# Patient Record
Sex: Female | Born: 1950
Health system: Southern US, Community
[De-identification: ages and names within clinical notes are randomized; demographics above are authoritative.]

## PROBLEM LIST (undated history)

## (undated) DIAGNOSIS — F32A Depression, unspecified: Secondary | ICD-10-CM

## (undated) DIAGNOSIS — D860 Sarcoidosis of lung: Secondary | ICD-10-CM

## (undated) DIAGNOSIS — E785 Hyperlipidemia, unspecified: Secondary | ICD-10-CM

## (undated) DIAGNOSIS — M797 Fibromyalgia: Secondary | ICD-10-CM

## (undated) DIAGNOSIS — J310 Chronic rhinitis: Secondary | ICD-10-CM

## (undated) DIAGNOSIS — I2699 Other pulmonary embolism without acute cor pulmonale: Secondary | ICD-10-CM

## (undated) DIAGNOSIS — F419 Anxiety disorder, unspecified: Secondary | ICD-10-CM

## (undated) DIAGNOSIS — F329 Major depressive disorder, single episode, unspecified: Secondary | ICD-10-CM

## (undated) DIAGNOSIS — G459 Transient cerebral ischemic attack, unspecified: Secondary | ICD-10-CM

## (undated) DIAGNOSIS — I10 Essential (primary) hypertension: Secondary | ICD-10-CM

## (undated) HISTORY — PX: HYSTEROSCOPY: SHX211

## (undated) HISTORY — PX: JOINT REPLACEMENT: SHX530

## (undated) HISTORY — PX: BUNIONECTOMY: SHX129

## (undated) HISTORY — PX: OTHER SURGICAL HISTORY: SHX169

## (undated) HISTORY — PX: LUMBAR LAMINECTOMY: SHX95

## (undated) HISTORY — PX: ABDOMINAL HYSTERECTOMY: SHX81

---

## 2007-05-09 ENCOUNTER — Encounter: Admission: RE | Admit: 2007-05-09 | Discharge: 2007-05-09 | Payer: Self-pay | Admitting: Internal Medicine

## 2008-05-10 ENCOUNTER — Encounter: Admission: RE | Admit: 2008-05-10 | Discharge: 2008-05-10 | Payer: Self-pay | Admitting: Family Medicine

## 2009-04-25 ENCOUNTER — Ambulatory Visit: Payer: Self-pay | Admitting: Diagnostic Radiology

## 2009-04-25 ENCOUNTER — Emergency Department (HOSPITAL_BASED_OUTPATIENT_CLINIC_OR_DEPARTMENT_OTHER): Admission: EM | Admit: 2009-04-25 | Discharge: 2009-04-25 | Payer: Self-pay | Admitting: Emergency Medicine

## 2009-05-12 ENCOUNTER — Encounter: Admission: RE | Admit: 2009-05-12 | Discharge: 2009-05-12 | Payer: Self-pay | Admitting: Unknown Physician Specialty

## 2010-07-15 ENCOUNTER — Encounter: Payer: Self-pay | Admitting: Internal Medicine

## 2011-02-26 ENCOUNTER — Encounter: Payer: Self-pay | Admitting: *Deleted

## 2011-02-26 ENCOUNTER — Emergency Department (INDEPENDENT_AMBULATORY_CARE_PROVIDER_SITE_OTHER): Payer: Medicare Other

## 2011-02-26 ENCOUNTER — Emergency Department (HOSPITAL_BASED_OUTPATIENT_CLINIC_OR_DEPARTMENT_OTHER)
Admission: EM | Admit: 2011-02-26 | Discharge: 2011-02-26 | Disposition: A | Discharge status: Home / Self Care | Payer: Medicare Other | Attending: Emergency Medicine | Admitting: Emergency Medicine

## 2011-02-26 DIAGNOSIS — E119 Type 2 diabetes mellitus without complications: Secondary | ICD-10-CM | POA: Insufficient documentation

## 2011-02-26 DIAGNOSIS — Z8679 Personal history of other diseases of the circulatory system: Secondary | ICD-10-CM | POA: Insufficient documentation

## 2011-02-26 DIAGNOSIS — I10 Essential (primary) hypertension: Secondary | ICD-10-CM | POA: Insufficient documentation

## 2011-02-26 DIAGNOSIS — M7989 Other specified soft tissue disorders: Secondary | ICD-10-CM

## 2011-02-26 DIAGNOSIS — E785 Hyperlipidemia, unspecified: Secondary | ICD-10-CM | POA: Insufficient documentation

## 2011-02-26 DIAGNOSIS — M79606 Pain in leg, unspecified: Secondary | ICD-10-CM

## 2011-02-26 DIAGNOSIS — M79609 Pain in unspecified limb: Secondary | ICD-10-CM

## 2011-02-26 HISTORY — DX: Major depressive disorder, single episode, unspecified: F32.9

## 2011-02-26 HISTORY — DX: Essential (primary) hypertension: I10

## 2011-02-26 HISTORY — DX: Chronic rhinitis: J31.0

## 2011-02-26 HISTORY — DX: Hyperlipidemia, unspecified: E78.5

## 2011-02-26 HISTORY — DX: Sarcoidosis of lung: D86.0

## 2011-02-26 HISTORY — DX: Transient cerebral ischemic attack, unspecified: G45.9

## 2011-02-26 HISTORY — DX: Fibromyalgia: M79.7

## 2011-02-26 HISTORY — DX: Anxiety disorder, unspecified: F41.9

## 2011-02-26 HISTORY — DX: Depression, unspecified: F32.A

## 2011-02-26 NOTE — ED Notes (Signed)
Patient states she has pain in her left calf for the last 2 days.  Hx DVT.  Has artifical knee on right and right leg feels tingling.  States she does not have any energy.  States she also have inflammation on her left side and has been treated with ibuprofen by her pcp.

## 2011-02-26 NOTE — ED Provider Notes (Addendum)
History     CSN: [id number] Arrival date & time: 02/26/2011 10:46 AM  Chief Complaint  Patient presents with  . Leg Pain    pain in her left calf for the last 2 days,  artificial knee on right does not feel right.   HPI Lower extremity pain  Patient has noted pain in her left calf for last 2 days. She denies history of DVT. She states that she has had a PE in the past. She denies associated and mobilized or on any long trips. She has not taken any hormones. Denies any in injury to this area. She denies any chest pain or shortness of breath.  Past Medical History  Diagnosis Date  . Diabetes mellitus   . Hypertension   . Hyperlipemia   . Rhinitis   . TIA (transient ischemic attack)     x 2  . Sarcoidosis of lung   . Fibromyalgia   . Anxiety   . Depressed     Past Surgical History  Procedure Date  . Joint replacement   . Cesarean section     x 2  . Bunionectomy   . Abdominal hysterectomy   . Hysteroscopy     No family history on file.  History  Substance Use Topics  . Smoking status: Former Games developer  . Smokeless tobacco: Not on file  . Alcohol Use: No    OB History    Grav Para Term Preterm Abortions TAB SAB Ect Mult Living                  Review of Systems  All other systems reviewed and are negative.    Physical Exam  BP 126/76  Pulse 98  Temp(Src) 98.5 F (36.9 C) (Oral)  Resp 20  Ht 5\' 4"  (1.626 m)  Wt 251 lb (113.853 kg)  BMI 43.08 kg/m2  SpO2 96%  Physical Exam  Vitals reviewed. Constitutional: She is oriented to person, place, and time. She appears well-developed and well-nourished.  HENT:  Head: Normocephalic.  Eyes: Pupils are equal, round, and reactive to light.  Neck: Normal range of motion.  Cardiovascular: Normal rate.   Musculoskeletal: Normal range of motion. She exhibits no edema.  Neurological: She is alert and oriented to person, place, and time. She has normal reflexes.  Skin: Skin is warm.  Psychiatric: She has a normal  mood and affect.    ED Course  Procedures  MDM Patient with negative ultrasound for DVT.      Hilario Quarry, MD 03/07/11 Rickey Primus  Hilario Quarry, MD 03/07/11 Rickey Primus

## 2013-02-14 ENCOUNTER — Emergency Department (HOSPITAL_BASED_OUTPATIENT_CLINIC_OR_DEPARTMENT_OTHER)
Admission: EM | Admit: 2013-02-14 | Discharge: 2013-02-14 | Disposition: A | Discharge status: Home / Self Care | Payer: Medicare Other | Attending: Emergency Medicine | Admitting: Emergency Medicine

## 2013-02-14 ENCOUNTER — Encounter (HOSPITAL_BASED_OUTPATIENT_CLINIC_OR_DEPARTMENT_OTHER): Payer: Self-pay | Admitting: *Deleted

## 2013-02-14 DIAGNOSIS — Z880 Allergy status to penicillin: Secondary | ICD-10-CM | POA: Insufficient documentation

## 2013-02-14 DIAGNOSIS — Z87891 Personal history of nicotine dependence: Secondary | ICD-10-CM | POA: Insufficient documentation

## 2013-02-14 DIAGNOSIS — F3289 Other specified depressive episodes: Secondary | ICD-10-CM | POA: Insufficient documentation

## 2013-02-14 DIAGNOSIS — F329 Major depressive disorder, single episode, unspecified: Secondary | ICD-10-CM | POA: Insufficient documentation

## 2013-02-14 DIAGNOSIS — M7989 Other specified soft tissue disorders: Secondary | ICD-10-CM | POA: Insufficient documentation

## 2013-02-14 DIAGNOSIS — Z8673 Personal history of transient ischemic attack (TIA), and cerebral infarction without residual deficits: Secondary | ICD-10-CM | POA: Insufficient documentation

## 2013-02-14 DIAGNOSIS — E785 Hyperlipidemia, unspecified: Secondary | ICD-10-CM | POA: Insufficient documentation

## 2013-02-14 DIAGNOSIS — IMO0002 Reserved for concepts with insufficient information to code with codable children: Secondary | ICD-10-CM | POA: Insufficient documentation

## 2013-02-14 DIAGNOSIS — Z8619 Personal history of other infectious and parasitic diseases: Secondary | ICD-10-CM | POA: Insufficient documentation

## 2013-02-14 DIAGNOSIS — F411 Generalized anxiety disorder: Secondary | ICD-10-CM | POA: Insufficient documentation

## 2013-02-14 DIAGNOSIS — Z79899 Other long term (current) drug therapy: Secondary | ICD-10-CM | POA: Insufficient documentation

## 2013-02-14 DIAGNOSIS — I10 Essential (primary) hypertension: Secondary | ICD-10-CM | POA: Insufficient documentation

## 2013-02-14 DIAGNOSIS — Z86711 Personal history of pulmonary embolism: Secondary | ICD-10-CM | POA: Insufficient documentation

## 2013-02-14 DIAGNOSIS — Z7982 Long term (current) use of aspirin: Secondary | ICD-10-CM | POA: Insufficient documentation

## 2013-02-14 DIAGNOSIS — E119 Type 2 diabetes mellitus without complications: Secondary | ICD-10-CM | POA: Insufficient documentation

## 2013-02-14 DIAGNOSIS — Z87440 Personal history of urinary (tract) infections: Secondary | ICD-10-CM | POA: Insufficient documentation

## 2013-02-14 DIAGNOSIS — IMO0001 Reserved for inherently not codable concepts without codable children: Secondary | ICD-10-CM | POA: Insufficient documentation

## 2013-02-14 HISTORY — DX: Other pulmonary embolism without acute cor pulmonale: I26.99

## 2013-02-14 LAB — URINALYSIS, ROUTINE W REFLEX MICROSCOPIC
Glucose, UA: NEGATIVE mg/dL
Hgb urine dipstick: NEGATIVE
Leukocytes, UA: NEGATIVE
Protein, ur: NEGATIVE mg/dL
pH: 6.5 (ref 5.0–8.0)

## 2013-02-14 NOTE — ED Provider Notes (Signed)
CSN: [id number]     Arrival date & time 02/14/13  1148 History     First MD Initiated Contact with Patient 02/14/13 1311     Chief Complaint  Patient presents with  . Leg Swelling   (Consider location/radiation/quality/duration/timing/severity/associated sxs/prior Treatment) HPI Comments: 62 yo female with complaints of left leg swelling in setting of being treated for UTI with septra.  She feels a mild heaviness to left leg, but no significant pain.    Patient is a 62 y.o. female presenting with leg pain.  Leg Pain Location:  Leg Injury: no   Leg location:  L leg Pain details:    Quality: "heavy"   Radiates to:  Does not radiate   Severity:  Mild   Onset quality:  Gradual   Duration:  1 day   Timing:  Constant   Progression:  Worsening Relieved by:  Nothing Worsened by:  Nothing tried Associated symptoms: swelling   Associated symptoms: no fever, no muscle weakness and no numbness     Past Medical History  Diagnosis Date  . Diabetes mellitus   . Hypertension   . Hyperlipemia   . Rhinitis   . TIA (transient ischemic attack)     x 2  . Sarcoidosis of lung   . Fibromyalgia   . Anxiety   . Depressed   . TIA (transient ischemic attack)   . PE (pulmonary embolism)    Past Surgical History  Procedure Laterality Date  . Joint replacement    . Cesarean section      x 2  . Bunionectomy    . Abdominal hysterectomy    . Hysteroscopy    . Lumbar laminectomy     No family history on file. History  Substance Use Topics  . Smoking status: Former Games developer  . Smokeless tobacco: Not on file  . Alcohol Use: No   OB History   Grav Para Term Preterm Abortions TAB SAB Ect Mult Living                 Review of Systems  Constitutional: Negative for fever.  HENT: Negative for congestion.   Cardiovascular: Negative for chest pain.  Gastrointestinal: Negative for nausea, vomiting, abdominal pain and diarrhea.  All other systems reviewed and are negative.    Allergies   Amoxicillin-pot clavulanate and Contrast media  Home Medications   Current Outpatient Rx  Name  Route  Sig  Dispense  Refill  . furosemide (LASIX) 40 MG tablet   Oral   Take 40 mg by mouth.         Marland Kitchen LORazepam (ATIVAN) 0.5 MG tablet   Oral   Take 0.5 mg by mouth every 8 (eight) hours.         Marland Kitchen acetaminophen (TYLENOL) 500 MG tablet   Oral   Take 1,000 mg by mouth every 6 (six) hours as needed.           Marland Kitchen albuterol (PROVENTIL) (2.5 MG/3ML) 0.083% nebulizer solution   Nebulization   Take by nebulization every 6 (six) hours as needed.           Marland Kitchen aspirin 81 MG tablet   Oral   Take 81 mg by mouth daily.           . cholecalciferol (VITAMIN D) 1000 UNITS tablet   Oral   Take 1,000 Units by mouth daily.           . fish oil-omega-3 fatty acids 1000 MG capsule  Oral   Take 2 g by mouth daily.           . fluticasone (FLONASE) 50 MCG/ACT nasal spray   Nasal   Place 2 sprays into the nose daily.           . folic acid (FOLVITE) 1 MG tablet   Oral   Take 1 mg by mouth daily.           Marland Kitchen ibuprofen (ADVIL,MOTRIN) 100 MG chewable tablet   Oral   Chew 100 mg by mouth every 8 (eight) hours as needed.           Marland Kitchen lisinopril (PRINIVIL,ZESTRIL) 5 MG tablet   Oral   Take 5 mg by mouth daily.           Marland Kitchen loratadine (CLARITIN) 10 MG tablet   Oral   Take 10 mg by mouth daily.           Marland Kitchen LORazepam (ATIVAN) 0.5 MG tablet   Oral   Take 0.5 mg by mouth every 8 (eight) hours.           . lovastatin (ALTOPREV) 40 MG 24 hr tablet   Oral   Take 80 mg by mouth at bedtime.           . metFORMIN (GLUMETZA) 1000 MG (MOD) 24 hr tablet   Oral   Take 1,000 mg by mouth daily with breakfast.           . pyridOXINE (VITAMIN B-6) 100 MG tablet   Oral   Take 100 mg by mouth daily.           . vitamin B-12 (CYANOCOBALAMIN) 500 MCG tablet   Oral   Take 500 mcg by mouth daily.            BP 124/72  Pulse 85  Temp(Src) 98.4 F (36.9 C) (Oral)   Resp 18  Ht 5\' 4"  (1.626 m)  Wt 252 lb (114.306 kg)  BMI 43.23 kg/m2  SpO2 96% Physical Exam  Nursing note and vitals reviewed. Constitutional: She is oriented to person, place, and time. She appears well-developed and well-nourished. No distress.  HENT:  Head: Normocephalic and atraumatic.  Mouth/Throat: Oropharynx is clear and moist.  Eyes: Conjunctivae are normal. Pupils are equal, round, and reactive to light. No scleral icterus.  Neck: Neck supple.  Cardiovascular: Normal rate, regular rhythm, normal heart sounds and intact distal pulses.   No murmur heard. Pulmonary/Chest: Effort normal and breath sounds normal. No stridor. No respiratory distress. She has no rales.  Abdominal: Soft. Bowel sounds are normal. She exhibits no distension. There is no tenderness.  Musculoskeletal: Normal range of motion.  Left leg comparable in size to right without any discernable edema or pitting.  2+ distal pulse, normal sensation and strength.    Neurological: She is alert and oriented to person, place, and time.  Skin: Skin is warm and dry. No rash noted.  Psychiatric: She has a normal mood and affect. Her behavior is normal.    ED Course   Procedures (including critical care time)  Labs Reviewed  URINALYSIS, ROUTINE W REFLEX MICROSCOPIC   No results found. 1. Left leg swelling     MDM  62 yo female with hx of PE and left knee replacement presenting with left leg heaviness and subjective swelling since yesterday.  No apparent swelling on exam and legs are comparable diameter on objective measurement.  No signs of DVT, decreased perfusion, stroke, or infection.  She subsequently remembered that she has not taken her furosemide in several doses.  Offered D-Dimer testing, but she declined.  She will follow up with PCP if symptoms still present on Monday.    Candyce Churn, MD 02/14/13 [phone]

## 2013-02-14 NOTE — ED Notes (Signed)
Patient is currently being treated for UTI and now is having leg swelling.

## 2014-07-04 ENCOUNTER — Emergency Department (HOSPITAL_COMMUNITY): Payer: Medicare HMO

## 2014-07-04 ENCOUNTER — Encounter (HOSPITAL_COMMUNITY): Payer: Self-pay

## 2014-07-04 ENCOUNTER — Observation Stay (HOSPITAL_COMMUNITY)
Admission: EM | Admit: 2014-07-04 | Discharge: 2014-07-05 | Disposition: A | Discharge status: Home / Self Care | Payer: Medicare HMO | Attending: Internal Medicine | Admitting: Internal Medicine

## 2014-07-04 DIAGNOSIS — E785 Hyperlipidemia, unspecified: Secondary | ICD-10-CM

## 2014-07-04 DIAGNOSIS — R079 Chest pain, unspecified: Secondary | ICD-10-CM | POA: Diagnosis present

## 2014-07-04 DIAGNOSIS — D860 Sarcoidosis of lung: Secondary | ICD-10-CM | POA: Diagnosis not present

## 2014-07-04 DIAGNOSIS — F329 Major depressive disorder, single episode, unspecified: Secondary | ICD-10-CM | POA: Diagnosis not present

## 2014-07-04 DIAGNOSIS — Z79899 Other long term (current) drug therapy: Secondary | ICD-10-CM | POA: Diagnosis not present

## 2014-07-04 DIAGNOSIS — Z7982 Long term (current) use of aspirin: Secondary | ICD-10-CM | POA: Diagnosis not present

## 2014-07-04 DIAGNOSIS — Z86711 Personal history of pulmonary embolism: Secondary | ICD-10-CM | POA: Insufficient documentation

## 2014-07-04 DIAGNOSIS — Z880 Allergy status to penicillin: Secondary | ICD-10-CM | POA: Diagnosis not present

## 2014-07-04 DIAGNOSIS — D869 Sarcoidosis, unspecified: Secondary | ICD-10-CM

## 2014-07-04 DIAGNOSIS — F419 Anxiety disorder, unspecified: Secondary | ICD-10-CM | POA: Diagnosis not present

## 2014-07-04 DIAGNOSIS — E119 Type 2 diabetes mellitus without complications: Secondary | ICD-10-CM | POA: Diagnosis not present

## 2014-07-04 DIAGNOSIS — Z7952 Long term (current) use of systemic steroids: Secondary | ICD-10-CM | POA: Diagnosis not present

## 2014-07-04 DIAGNOSIS — Z87891 Personal history of nicotine dependence: Secondary | ICD-10-CM | POA: Diagnosis not present

## 2014-07-04 DIAGNOSIS — Z8673 Personal history of transient ischemic attack (TIA), and cerebral infarction without residual deficits: Secondary | ICD-10-CM | POA: Diagnosis not present

## 2014-07-04 DIAGNOSIS — R112 Nausea with vomiting, unspecified: Secondary | ICD-10-CM | POA: Diagnosis not present

## 2014-07-04 DIAGNOSIS — M797 Fibromyalgia: Secondary | ICD-10-CM | POA: Insufficient documentation

## 2014-07-04 DIAGNOSIS — Z6841 Body Mass Index (BMI) 40.0 and over, adult: Secondary | ICD-10-CM

## 2014-07-04 DIAGNOSIS — I10 Essential (primary) hypertension: Secondary | ICD-10-CM

## 2014-07-04 LAB — I-STAT TROPONIN, ED
TROPONIN I, POC: 0 ng/mL (ref 0.00–0.08)
Troponin i, poc: 0 ng/mL (ref 0.00–0.08)
Troponin i, poc: 0 ng/mL (ref 0.00–0.08)

## 2014-07-04 LAB — COMPREHENSIVE METABOLIC PANEL
ALK PHOS: 66 U/L (ref 39–117)
ALT: 17 U/L (ref 0–35)
ANION GAP: 9 (ref 5–15)
AST: 17 U/L (ref 0–37)
Albumin: 3.5 g/dL (ref 3.5–5.2)
BILIRUBIN TOTAL: 1 mg/dL (ref 0.3–1.2)
BUN: 10 mg/dL (ref 6–23)
CHLORIDE: 103 meq/L (ref 96–112)
CO2: 27 mmol/L (ref 19–32)
Calcium: 9.2 mg/dL (ref 8.4–10.5)
Creatinine, Ser: 0.63 mg/dL (ref 0.50–1.10)
GFR calc Af Amer: >90 mL/min (ref >90)
Glucose, Bld: 80 mg/dL (ref 70–99)
POTASSIUM: 3.6 mmol/L (ref 3.5–5.1)
SODIUM: 139 mmol/L (ref 135–145)
TOTAL PROTEIN: 6.8 g/dL (ref 6.0–8.3)

## 2014-07-04 LAB — CBC
HEMATOCRIT: 38 % (ref 36.0–46.0)
Hemoglobin: 12.2 g/dL (ref 12.0–15.0)
MCH: 27.5 pg (ref 26.0–34.0)
MCHC: 32.1 g/dL (ref 30.0–36.0)
MCV: 85.8 fL (ref 78.0–100.0)
Platelets: 263 10*3/uL (ref 150–400)
RBC: 4.43 MIL/uL (ref 3.87–5.11)
RDW: 14.3 % (ref 11.5–15.5)
WBC: 6 10*3/uL (ref 4.0–10.5)

## 2014-07-04 LAB — APTT: APTT: 25 s (ref 24–37)

## 2014-07-04 LAB — PROTIME-INR
INR: 1.04 (ref 0.00–1.49)
PROTHROMBIN TIME: 13.7 s (ref 11.6–15.2)

## 2014-07-04 LAB — D-DIMER, QUANTITATIVE: D-Dimer, Quant: 0.44 ug/mL-FEU (ref 0.00–0.48)

## 2014-07-04 MED ORDER — FOLIC ACID 1 MG PO TABS
1.0000 mg | ORAL_TABLET | Freq: Every day | ORAL | Status: DC
  Administered 2014-07-05: 1 mg via ORAL
  Filled 2014-07-04: qty 1

## 2014-07-04 MED ORDER — ALUM & MAG HYDROXIDE-SIMETH 200-200-20 MG/5ML PO SUSP: 15.0000 mL | Freq: Four times a day (QID) | ORAL | Status: DC | PRN

## 2014-07-04 MED ORDER — IPRATROPIUM BROMIDE 0.02 % IN SOLN: 0.5000 mg | Freq: Four times a day (QID) | RESPIRATORY_TRACT | Status: DC | PRN

## 2014-07-04 MED ORDER — LISINOPRIL 2.5 MG PO TABS
2.5000 mg | ORAL_TABLET | Freq: Every day | ORAL | Status: DC
  Filled 2014-07-04 (×2): qty 1

## 2014-07-04 MED ORDER — ONDANSETRON HCL 4 MG/2ML IJ SOLN
INTRAMUSCULAR | Status: AC
  Filled 2014-07-04: qty 2

## 2014-07-04 MED ORDER — LORAZEPAM 0.5 MG PO TABS: 0.5000 mg | ORAL_TABLET | Freq: Two times a day (BID) | ORAL | Status: DC | PRN

## 2014-07-04 MED ORDER — ENOXAPARIN SODIUM 40 MG/0.4ML ~~LOC~~ SOLN
40.0000 mg | Freq: Every day | SUBCUTANEOUS | Status: DC
  Administered 2014-07-05: 40 mg via SUBCUTANEOUS
  Filled 2014-07-04 (×2): qty 0.4

## 2014-07-04 MED ORDER — LORATADINE 10 MG PO TABS
10.0000 mg | ORAL_TABLET | Freq: Every day | ORAL | Status: DC
  Administered 2014-07-05: 10 mg via ORAL
  Filled 2014-07-04 (×2): qty 1

## 2014-07-04 MED ORDER — PRAVASTATIN SODIUM 40 MG PO TABS
80.0000 mg | ORAL_TABLET | Freq: Every day | ORAL | Status: DC
  Filled 2014-07-04 (×2): qty 1

## 2014-07-04 MED ORDER — NITROGLYCERIN 2 % TD OINT
1.0000 [in_us] | TOPICAL_OINTMENT | Freq: Once | TRANSDERMAL | Status: AC
  Administered 2014-07-04: 1 [in_us] via TOPICAL
  Filled 2014-07-04: qty 1

## 2014-07-04 MED ORDER — MORPHINE SULFATE 4 MG/ML IJ SOLN
4.0000 mg | Freq: Once | INTRAMUSCULAR | Status: AC
  Administered 2014-07-04: 4 mg via INTRAVENOUS
  Filled 2014-07-04: qty 1

## 2014-07-04 MED ORDER — FAMOTIDINE 20 MG PO TABS
20.0000 mg | ORAL_TABLET | Freq: Two times a day (BID) | ORAL | Status: DC
  Administered 2014-07-05 (×2): 20 mg via ORAL
  Filled 2014-07-04 (×2): qty 1

## 2014-07-04 MED ORDER — HYDROCODONE-ACETAMINOPHEN 7.5-325 MG PO TABS
1.0000 | ORAL_TABLET | Freq: Four times a day (QID) | ORAL | Status: DC | PRN
Start: 2014-07-04 — End: 2014-07-05
  Administered 2014-07-05 (×3): 1 via ORAL
  Filled 2014-07-04 (×3): qty 1

## 2014-07-04 MED ORDER — SODIUM CHLORIDE 0.9 % IV SOLN
1000.0000 mL | INTRAVENOUS | Status: DC
  Administered 2014-07-04: 1000 mL via INTRAVENOUS

## 2014-07-04 MED ORDER — ASPIRIN EC 81 MG PO TBEC
81.0000 mg | DELAYED_RELEASE_TABLET | Freq: Every day | ORAL | Status: DC
Start: 2014-07-05 — End: 2014-07-05
  Filled 2014-07-04: qty 1

## 2014-07-04 MED ORDER — PANTOPRAZOLE SODIUM 20 MG PO TBEC
20.0000 mg | DELAYED_RELEASE_TABLET | Freq: Every day | ORAL | Status: DC
  Administered 2014-07-05: 20 mg via ORAL
  Filled 2014-07-04 (×2): qty 1

## 2014-07-04 MED ORDER — VITAMIN D 1000 UNITS PO TABS
1000.0000 [IU] | ORAL_TABLET | Freq: Every day | ORAL | Status: DC
Start: 2014-07-05 — End: 2014-07-05
  Administered 2014-07-05: 1000 [IU] via ORAL
  Filled 2014-07-04: qty 1

## 2014-07-04 MED ORDER — NITROGLYCERIN 0.4 MG SL SUBL
0.4000 mg | SUBLINGUAL_TABLET | SUBLINGUAL | Status: DC | PRN
  Administered 2014-07-04: 0.4 mg via SUBLINGUAL
  Filled 2014-07-04: qty 1

## 2014-07-04 MED ORDER — ONDANSETRON HCL 4 MG/2ML IJ SOLN
4.0000 mg | Freq: Once | INTRAMUSCULAR | Status: AC
  Administered 2014-07-04: 4 mg via INTRAVENOUS

## 2014-07-04 MED ORDER — ACETAMINOPHEN 325 MG PO TABS: 650.0000 mg | ORAL_TABLET | ORAL | Status: DC | PRN

## 2014-07-04 NOTE — ED Notes (Signed)
Pt declined NTG at this time.  NTG causing headache.

## 2014-07-04 NOTE — ED Provider Notes (Signed)
CSN: [id number]     Arrival date & time 07/04/14  1257 History   First MD Initiated Contact with Patient 07/04/14 1313     Chief Complaint  Patient presents with  . Chest Pain     HPI Comments: The patient had an episode of chest pain this morning that occurred suddenly while she was getting dressed. It was a grabbing type pain in the center of her chest that lasted 2-3 minutes. The patient called EMS. Her pain resolved but since she has been in the emergency room she has noted some tightness again on the left side of her chest. Earlier in the week she had mentioned to her doctor having some intermittent episodes of chest discomfort. She was diagnosed with inflammation associated with her sarcoid and started on prednisone  Patient is a 64 y.o. female presenting with chest pain. The history is provided by the patient.  Chest Pain Pain location:  Substernal area and L chest Pain quality: tightness   Pain radiates to:  Does not radiate Pain severity:  Moderate Onset quality:  Sudden Timing:  Intermittent Chronicity:  New Relieved by: The patient took 4 aspirin at home. Associated symptoms: shortness of breath (she does not actually feel short of breath but she is breathing "harder")   Associated symptoms: no abdominal pain, no anorexia, no back pain, not vomiting and no weakness   Risk factors: diabetes mellitus, hypertension and prior DVT/PE   Risk factors: no coronary artery disease     Past Medical History  Diagnosis Date  . Diabetes mellitus   . Hypertension   . Hyperlipemia   . Rhinitis   . TIA (transient ischemic attack)     x 2  . Sarcoidosis of lung   . Fibromyalgia   . Anxiety   . Depressed   . TIA (transient ischemic attack)   . PE (pulmonary embolism)    Past Surgical History  Procedure Laterality Date  . Joint replacement    . Cesarean section      x 2  . Bunionectomy    . Abdominal hysterectomy    . Hysteroscopy    . Lumbar laminectomy     History  reviewed. No pertinent family history. History  Substance Use Topics  . Smoking status: Former Research scientist (life sciences)  . Smokeless tobacco: Not on file  . Alcohol Use: No   OB History    No data available     Review of Systems  Respiratory: Positive for shortness of breath (she does not actually feel short of breath but she is breathing "harder").   Cardiovascular: Positive for chest pain.  Gastrointestinal: Negative for vomiting, abdominal pain and anorexia.  Musculoskeletal: Negative for back pain.       Chronic swelling left leg since knee surgery  Neurological: Negative for weakness.  All other systems reviewed and are negative.     Allergies  Other; Amoxicillin-pot clavulanate; and Contrast media  Home Medications   Prior to Admission medications   Medication Sig Start Date End Date Taking? Authorizing Provider  aspirin 81 MG tablet Take 81 mg by mouth daily.   Yes Historical Provider, MD  cholecalciferol (VITAMIN D) 1000 UNITS tablet Take 1,000 Units by mouth daily.     Yes Historical Provider, MD  CRANBERRY EXTRACT PO Take 1 capsule by mouth daily.   Yes Historical Provider, MD  Cyanocobalamin (B-12 PO) Take 1 tablet by mouth daily.   Yes Historical Provider, MD  fish oil-omega-3 fatty acids 1000 MG capsule Take  1 g by mouth daily.    Yes Historical Provider, MD  folic acid (FOLVITE) 1 MG tablet Take 1 mg by mouth daily.   Yes Historical Provider, MD  furosemide (LASIX) 40 MG tablet Take 40 mg by mouth 2 (two) times a week.    Yes Historical Provider, MD  HYDROcodone-acetaminophen (NORCO) 7.5-325 MG per tablet Take 1 tablet by mouth every 6 (six) hours as needed for moderate pain.   Yes Historical Provider, MD  ipratropium (ATROVENT HFA) 17 MCG/ACT inhaler Inhale 2 puffs into the lungs every 6 (six) hours as needed for wheezing.   Yes Historical Provider, MD  lisinopril (PRINIVIL,ZESTRIL) 5 MG tablet Take 2.5 mg by mouth daily.    Yes Historical Provider, MD  loratadine (CLARITIN) 10  MG tablet Take 10 mg by mouth daily.     Yes Historical Provider, MD  LORazepam (ATIVAN) 0.5 MG tablet Take 0.5 mg by mouth 2 (two) times daily as needed for anxiety.   Yes Historical Provider, MD  metFORMIN (GLUCOPHAGE) 500 MG tablet Take 500 mg by mouth daily with breakfast.   Yes Historical Provider, MD  Misc Natural Products (TART CHERRY ADVANCED PO) Take 1 tablet by mouth daily.   Yes Historical Provider, MD  pravastatin (PRAVACHOL) 80 MG tablet Take 80 mg by mouth daily.   Yes Historical Provider, MD  Probiotic Product (PROBIOTIC DAILY) CAPS Take 1 capsule by mouth daily.   Yes Historical Provider, MD  Pyridoxine HCl (B-6 PO) Take 1 tablet by mouth daily.   Yes Historical Provider, MD  predniSONE (STERAPRED UNI-PAK) 10 MG tablet Take 10-60 tablets by mouth daily.    Historical Provider, MD   BP 122/61 mmHg  Pulse 44  Temp(Src) 98 F (36.7 C) (Oral)  Resp 19  Ht 5\' 4"  (1.626 m)  Wt 256 lb (116.121 kg)  BMI 43.92 kg/m2  SpO2 93% Physical Exam  Constitutional: She appears well-developed and well-nourished. No distress.  HENT:  Head: Normocephalic and atraumatic.  Right Ear: External ear normal.  Left Ear: External ear normal.  Eyes: Conjunctivae are normal. Right eye exhibits no discharge. Left eye exhibits no discharge. No scleral icterus.  Neck: Neck supple. No tracheal deviation present.  Cardiovascular: Normal rate, regular rhythm and intact distal pulses.   Pulmonary/Chest: Effort normal and breath sounds normal. No stridor. No respiratory distress. She has no wheezes. She has no rales.  Abdominal: Soft. Bowel sounds are normal. She exhibits no distension. There is no tenderness. There is no rebound and no guarding.  Musculoskeletal: She exhibits edema. She exhibits no tenderness.  Bilateral, left greater than right   Neurological: She is alert. She has normal strength. No cranial nerve deficit (no facial droop, extraocular movements intact, no slurred speech) or sensory  deficit. She exhibits normal muscle tone. She displays no seizure activity. Coordination normal.  Skin: Skin is warm and dry. No rash noted.  Psychiatric: She has a normal mood and affect.  Nursing note and vitals reviewed.   ED Course  Procedures (including critical care time) Labs Review Labs Reviewed  APTT  CBC  COMPREHENSIVE METABOLIC PANEL  PROTIME-INR  D-DIMER, QUANTITATIVE  I-STAT TROPOININ, ED    Imaging Review Dg Chest Portable 1 View  07/04/2014   CLINICAL DATA:  Midsternal chest pain and shortness of breath.  EXAM: PORTABLE CHEST - 1 VIEW  COMPARISON:  03/18/2014  FINDINGS: There is elevation of the right diaphragm. There is no focal parenchymal opacity, pleural effusion, or pneumothorax. The heart and mediastinal  contours are unremarkable.  The osseous structures are unremarkable.  IMPRESSION: No active disease.   Electronically Signed   By: Kathreen Devoid   On: 07/04/2014 15:19     EKG Interpretation   Date/Time:  Sunday July 04 2014 13:07:37 EST Ventricular Rate:  82 PR Interval:  200 QRS Duration: 76 QT Interval:  372 QTC Calculation: 434 R Axis:   61 Text Interpretation:  Normal sinus rhythm Normal ECG No previous tracing  Confirmed by Mayur Duman  MD-J, Glorie Dowlen ([id number]) on 07/04/2014 1:19:53 PM     Medications  0.9 %  sodium chloride infusion (1,000 mLs Intravenous New Bag/Given 07/04/14 1523)  nitroGLYCERIN (NITROSTAT) SL tablet 0.4 mg (0.4 mg Sublingual Given 07/04/14 1526)  nitroGLYCERIN (NITROGLYN) 2 % ointment 1 inch (1 inch Topical Given 07/04/14 1525)  morphine 4 MG/ML injection 4 mg (4 mg Intravenous Given 07/04/14 1523)  ondansetron (ZOFRAN) injection 4 mg (4 mg Intravenous Given 07/04/14 1547)    MDM   Final diagnoses:  Chest pain, unspecified chest pain type    Patient had an episode of nausea, vomiting associated with bradycardia after she was given the morphine. On the monitor she did have sinus bradycardia but also appeared to have some junctional  beats.  Symptoms have resolved now.  May have been vagally mediated because of the vomiting.  Will give dose of zofran.  Pt does have cardiac risk factors.  Symptoms concerning for possible angina.  Will consult with medical service regarding admission for cardiac eval.    Dorie Rank, MD 07/04/14 1550

## 2014-07-04 NOTE — ED Notes (Signed)
To room via EMS.  Onset this morning pt sitting on side of bed grabbing, tight pain to center of chest, lasted 2-3 minutes.  No pain at this time.  EMS BP 140/82, HR 92, RR 18, 99% RA, NSR.  Pt seen PCP last week for tightness pain on left side chest and left abd  DX with inflammation, finished Prednisone last night.  Pt reports chest and abd tightness returned last night after being resolved after starting Prednisone.   No other s/s noted at this time.

## 2014-07-04 NOTE — ED Notes (Signed)
HR 63, pt reports nausea resolved.  Chest and abd tightness is 6/10.  BP 96/63

## 2014-07-04 NOTE — H&P (Signed)
History and Physical  Samantha [name] KGY:[id number] DOB: April 07, 1951 DOA: 07/04/2014  Referring physician: ED PCP: Yong Channel, MD   Chief Complaint:  Substernal chest pain  HPI: Samantha [name] is a 64 y.o. female with h/o htn/hld/obesity/remote PE (due to contraception use)/ past smoker sent to ED by EMS due to sudden onset of substernal chest pain this am. The pain was a grabbing type pain in the center of her chest that lasted 2-3 minutes. The patient called EMS. Her pain resolved but since she has been in the emergency room she has noted some tightness again on the left side of her chest. She received morphine for pain but developed nausea and brief bradycardia, thought was related to morphine. Patient currently asymptomatic, but very anxious. Patient reported has been having left sided chest pain, thought was related to sarcoid flare up, PCP started her on a course of steroid which she has just finish taking. She reported recenly started a water aerobic class for fibromyalgia. Her first class was Friday 1/8.  Patient otherwise, denies sob, no doe, no fever, no cough, no ab pain, no diarrhea, does has chronic intermittent left lower extremity edema related left knee surgeryx2, have been on lasix twice a week. Reported walk with a cane due to knee and back problem, on chronic prn pain meds.  Patient also reported h/o an adverse reaction to pharmacological stress test 68yrs ago, was admitted to [facility] at the time, not able to provide further detail.  Review of Systems:    Details per HPI,   Review of systems are otherwise negative  Past Medical History  Diagnosis Date  . Diabetes mellitus   . Hypertension   . Hyperlipemia   . Rhinitis   . TIA (transient ischemic attack)     x 2  . Sarcoidosis of lung   . Fibromyalgia   . Anxiety   . Depressed   . TIA (transient ischemic attack)   . PE (pulmonary embolism)    Past Surgical History  Procedure Laterality Date  . Joint  replacement    . Cesarean section      x 2  . Bunionectomy    . Abdominal hysterectomy    . Hysteroscopy    . Lumbar laminectomy     Social History:  reports that she has quit smoking. She does not have any smokeless tobacco history on file. She reports that she does not drink alcohol or use illicit drugs. Patient lives at home & is able to participate in activities of daily living.  Allergies  Allergen Reactions  . Other Other (See Comments)    NUCLEAR MED CONTRAST/DYE caused cardiac arrest, pulseless, no readings at all  . Amoxicillin-Pot Clavulanate Other (See Comments)    lethargy  . Contrast Media [Iodinated Diagnostic Agents] Other (See Comments)    Patient states no reaction to iv dye, MRI dye or other dyes-EXCEPT NUCLEAR MEDICINE CONTRAST/DYE   Reported able to take amoxicillin, but had an adverse reaction to augmentin ( caused confusion/lethergy) History reviewed. No pertinent family history.    Prior to Admission medications   Medication Sig Start Date End Date Taking? Authorizing Provider  aspirin 81 MG tablet Take 81 mg by mouth daily.   Yes Historical Provider, MD  cholecalciferol (VITAMIN D) 1000 UNITS tablet Take 1,000 Units by mouth daily.     Yes Historical Provider, MD  CRANBERRY EXTRACT PO Take 1 capsule by mouth daily.   Yes Historical Provider, MD  Cyanocobalamin (B-12 PO) Take 1 tablet  by mouth daily.   Yes Historical Provider, MD  fish oil-omega-3 fatty acids 1000 MG capsule Take 1 g by mouth daily.    Yes Historical Provider, MD  folic acid (FOLVITE) 1 MG tablet Take 1 mg by mouth daily.   Yes Historical Provider, MD  furosemide (LASIX) 40 MG tablet Take 40 mg by mouth 2 (two) times a week.    Yes Historical Provider, MD  HYDROcodone-acetaminophen (NORCO) 7.5-325 MG per tablet Take 1 tablet by mouth every 6 (six) hours as needed for moderate pain.   Yes Historical Provider, MD  ipratropium (ATROVENT HFA) 17 MCG/ACT inhaler Inhale 2 puffs into the lungs every  6 (six) hours as needed for wheezing.   Yes Historical Provider, MD  lisinopril (PRINIVIL,ZESTRIL) 5 MG tablet Take 2.5 mg by mouth daily.    Yes Historical Provider, MD  loratadine (CLARITIN) 10 MG tablet Take 10 mg by mouth daily.     Yes Historical Provider, MD  LORazepam (ATIVAN) 0.5 MG tablet Take 0.5 mg by mouth 2 (two) times daily as needed for anxiety.   Yes Historical Provider, MD  metFORMIN (GLUCOPHAGE) 500 MG tablet Take 500 mg by mouth daily with breakfast.   Yes Historical Provider, MD  Misc Natural Products (TART CHERRY ADVANCED PO) Take 1 tablet by mouth daily.   Yes Historical Provider, MD  pravastatin (PRAVACHOL) 80 MG tablet Take 80 mg by mouth daily.   Yes Historical Provider, MD  Probiotic Product (PROBIOTIC DAILY) CAPS Take 1 capsule by mouth daily.   Yes Historical Provider, MD  Pyridoxine HCl (B-6 PO) Take 1 tablet by mouth daily.   Yes Historical Provider, MD  predniSONE (STERAPRED UNI-PAK) 10 MG tablet Take 10-60 tablets by mouth daily.    Historical Provider, MD    Physical Exam: BP 96/63 mmHg  Pulse 75  Temp(Src) 98 F (36.7 C) (Oral)  Resp 16  Ht 5\' 4"  (1.626 m)  Wt 116.121 kg (256 lb)  BMI 43.92 kg/m2  SpO2 98%  General:  Obese, anxious Eyes: PERRL ENT: unremarkable Neck: supple Cardiovascular: RRR Respiratory: CTABL Abdomen: soft, NT/ND, positive bowel sounds Skin: no rash Musculoskeletal: tender to palpation left rib cage, no erythema, no trauma. Chronic left lower extremity edema Psychiatric: anxious Neurologic: oriented x3, no focal deficit.          Labs on Admission:  Basic Metabolic Panel:  Recent Labs Lab 07/04/14 1444  NA 139  K 3.6  CL 103  CO2 27  GLUCOSE 80  BUN 10  CREATININE 0.63  CALCIUM 9.2   Liver Function Tests:  Recent Labs Lab 07/04/14 1444  AST 17  ALT 17  ALKPHOS 66  BILITOT 1.0  PROT 6.8  ALBUMIN 3.5   No results for input(s): LIPASE, AMYLASE in the last 168 hours. No results for input(s): AMMONIA in  the last 168 hours. CBC:  Recent Labs Lab 07/04/14 1444  WBC 6.0  HGB 12.2  HCT 38.0  MCV 85.8  PLT 263   Cardiac Enzymes: No results for input(s): CKTOTAL, CKMB, CKMBINDEX, TROPONINI in the last 168 hours.  BNP (last 3 results) No results for input(s): PROBNP in the last 8760 hours. CBG: No results for input(s): GLUCAP in the last 168 hours.  Radiological Exams on Admission: Dg Chest Portable 1 View  07/04/2014   CLINICAL DATA:  Midsternal chest pain and shortness of breath.  EXAM: PORTABLE CHEST - 1 VIEW  COMPARISON:  03/18/2014  FINDINGS: There is elevation of the right diaphragm. There  is no focal parenchymal opacity, pleural effusion, or pneumothorax. The heart and mediastinal contours are unremarkable.  The osseous structures are unremarkable.  IMPRESSION: No active disease.   Electronically Signed   By: Kathreen Devoid   On: 07/04/2014 15:19    EKG: Independently reviewed. NSR, unremarkable   Assessment/Plan Present on Admission:  . Chest pain  Chest pain: substernal, likely secondary to GERD due to recent steroid use+/-anxiety. D dimer negative. EKG/CXR/first set cardiac enzyme negative. But due to multiple risk factor including htn/hld/obesity/remote PE (due to contraception use)/ past smoker, will admit to hospital under observation status, tele, cycle cardiac enzymes, continue home meds asa/statin/acei, check tsh/lipid panel. Cardiology consulted, plan for am stress test, npo after midnight. Left sided chest pain with tender to palpation, likely musculoskeletal. Prn pain meds.   GERD: started ppi/pepcid/maalox prn  H/o sarcoid: stable, cxr unremarkable.   Consultants: cardiology  Code Status: full  Family Communication: patient and patient's mom  Disposition Plan: observation, stress test in am  Time spent: 80mins  Shirin Echeverry MD/PhD [facility] Pager [phone] If 7PM-7AM, please contact night-coverage at [email or web address], password [facility]

## 2014-07-04 NOTE — ED Notes (Signed)
Pt diaphoretic and feels nauseated.  Zofran ordered.

## 2014-07-04 NOTE — ED Notes (Signed)
Called to pts room pt reports feeling like she is going to pass out.  HR 44.  Dr. Tomi Bamberger made aware.  MD at bedside.

## 2014-07-04 NOTE — Consult Note (Signed)
CARDIOLOGY CONSULT NOTE  Patient ID: Samantha [name], MRN: [id number], DOB/AGE: [birth year] 62 y.o. Admit date: 07/04/2014 Date of Consult: 07/04/2014  Primary Physician: Yong Channel, MD Primary Cardiologist: unassigned  Chief Complaint: chest pain Reason for Consultation: need for further risk stratification  HPI: 64 y.o. female w/ PMHx significant for DM2, HTN, hyperlipidemia, TIA, remote PE assoc with OCP, sarcoidosis who presented to [facility] on 07/04/2014 with complaints of chest pain. She reports that 1 week ago she had diffuse left sided chest wall tenderness (previously told it was costochondritis) and was started on prednisone by her PCP. 6 day course and she reports relief with this until today when symptoms returned as she completed the 6 day course. Today, however, she experienced a different type of chest pain. Central chest, "grabbing sensation", associated with shortness of breath but no diaphoresis, nausea. Occurred at rest (her mobility is significantly limited) and lasted 2-3 minutes before resolving on its own. Never had pain like this before. Not like her fibromylagia pain.  Currently has left sided tenderness, similar to the first type of chest pain. After receiving morphine, she became acutely nauseated and had another pre-syncopal like episode. Monitor showed acute bradycardia to an escape rhythm for 30 secs before returning to sinus brady. Has had syncopal espisodes in the past, attributed to ?orthostasis ("too much lasix").   Received aspirin. Nitro paste.  Previously evaluated with a stress 6 years ago at [place] (records not available) . Apparently did not tolerate the chemical stress well (unclear details, sounds like she had a pre-syncopal episode with the tracer). Ultimately, had an exercise echo which she says was normal. NOT allergic to contrast dye but to nuclear tracer.   Remote history of PE in 1970s, dxed by angiogram, associated with smoking and OCPs. No LE  swelling. Nonpleuritic. D-dimer nl.   Past Medical History  Diagnosis Date  . Diabetes mellitus   . Hypertension   . Hyperlipemia   . Rhinitis   . TIA (transient ischemic attack)     x 2  . Sarcoidosis of lung   . Fibromyalgia   . Anxiety   . Depressed   . TIA (transient ischemic attack)   . PE (pulmonary embolism)       Surgical History:  Past Surgical History  Procedure Laterality Date  . Joint replacement    . Cesarean section      x 2  . Bunionectomy    . Abdominal hysterectomy    . Hysteroscopy    . Lumbar laminectomy       Home Meds: Prior to Admission medications   Medication Sig Start Date End Date Taking? Authorizing Provider  aspirin 81 MG tablet Take 81 mg by mouth daily.   Yes Historical Provider, MD  cholecalciferol (VITAMIN D) 1000 UNITS tablet Take 1,000 Units by mouth daily.     Yes Historical Provider, MD  CRANBERRY EXTRACT PO Take 1 capsule by mouth daily.   Yes Historical Provider, MD  Cyanocobalamin (B-12 PO) Take 1 tablet by mouth daily.   Yes Historical Provider, MD  fish oil-omega-3 fatty acids 1000 MG capsule Take 1 g by mouth daily.    Yes Historical Provider, MD  folic acid (FOLVITE) 1 MG tablet Take 1 mg by mouth daily.   Yes Historical Provider, MD  furosemide (LASIX) 40 MG tablet Take 40 mg by mouth 2 (two) times a week.    Yes Historical Provider, MD  HYDROcodone-acetaminophen (NORCO) 7.5-325 MG per tablet Take 1 tablet by mouth  every 6 (six) hours as needed for moderate pain.   Yes Historical Provider, MD  ipratropium (ATROVENT HFA) 17 MCG/ACT inhaler Inhale 2 puffs into the lungs every 6 (six) hours as needed for wheezing.   Yes Historical Provider, MD  lisinopril (PRINIVIL,ZESTRIL) 5 MG tablet Take 2.5 mg by mouth daily.    Yes Historical Provider, MD  loratadine (CLARITIN) 10 MG tablet Take 10 mg by mouth daily.     Yes Historical Provider, MD  LORazepam (ATIVAN) 0.5 MG tablet Take 0.5 mg by mouth 2 (two) times daily as needed for  anxiety.   Yes Historical Provider, MD  metFORMIN (GLUCOPHAGE) 500 MG tablet Take 500 mg by mouth daily with breakfast.   Yes Historical Provider, MD  Misc Natural Products (TART CHERRY ADVANCED PO) Take 1 tablet by mouth daily.   Yes Historical Provider, MD  pravastatin (PRAVACHOL) 80 MG tablet Take 80 mg by mouth daily.   Yes Historical Provider, MD  Probiotic Product (PROBIOTIC DAILY) CAPS Take 1 capsule by mouth daily.   Yes Historical Provider, MD  Pyridoxine HCl (B-6 PO) Take 1 tablet by mouth daily.   Yes Historical Provider, MD  predniSONE (STERAPRED UNI-PAK) 10 MG tablet Take 10-60 tablets by mouth daily.    Historical Provider, MD    Inpatient Medications:   . sodium chloride 1,000 mL (07/04/14 1523)    Allergies:  Allergies  Allergen Reactions  . Other Other (See Comments)    NUCLEAR MED CONTRAST/DYE caused cardiac arrest, pulseless, no readings at all  . Amoxicillin-Pot Clavulanate Other (See Comments)    lethargy  . Contrast Media [Iodinated Diagnostic Agents] Other (See Comments)    Patient states no reaction to iv dye, MRI dye or other dyes-EXCEPT NUCLEAR MEDICINE CONTRAST/DYE    History   Social History  . Marital Status: Divorced    Spouse Name: N/A    Number of Children: N/A  . Years of Education: N/A   Occupational History  . Not on file.   Social History Main Topics  . Smoking status: Former Research scientist (life sciences)  . Smokeless tobacco: Not on file  . Alcohol Use: No  . Drug Use: No  . Sexual Activity: Not on file   Other Topics Concern  . Not on file   Social History Narrative     History reviewed. No pertinent family history.  FH: M - CAD in her 24s F- CAD in his 47s  Review of Systems: General: negative for chills, fever, night sweats or weight changes.  Cardiovascular: see HPI Dermatological: negative for rash Respiratory: negative for cough or wheezing Urologic: negative for hematuria Abdominal: negative for nausea, vomiting, diarrhea, bright red  blood per rectum, melena, or hematemesis Neurologic: negative for visual changes, syncope, or dizziness All other systems reviewed and are otherwise negative except as noted above.  Labs: No results for input(s): CKTOTAL, CKMB, TROPONINI in the last 72 hours. Lab Results  Component Value Date   WBC 6.0 07/04/2014   HGB 12.2 07/04/2014   HCT 38.0 07/04/2014   MCV 85.8 07/04/2014   PLT 263 07/04/2014    Recent Labs Lab 07/04/14 1444  NA 139  K 3.6  CL 103  CO2 27  BUN 10  CREATININE 0.63  CALCIUM 9.2  PROT 6.8  BILITOT 1.0  ALKPHOS 66  ALT 17  AST 17  GLUCOSE 80   No results found for: CHOL, HDL, LDLCALC, TRIG Lab Results  Component Value Date   DDIMER 0.44 07/04/2014    Radiology/Studies:  Dg Chest Portable 1 View  07/04/2014   CLINICAL DATA:  Midsternal chest pain and shortness of breath.  EXAM: PORTABLE CHEST - 1 VIEW  COMPARISON:  03/18/2014  FINDINGS: There is elevation of the right diaphragm. There is no focal parenchymal opacity, pleural effusion, or pneumothorax. The heart and mediastinal contours are unremarkable.  The osseous structures are unremarkable.  IMPRESSION: No active disease.   Electronically Signed   By: Kathreen Devoid   On: 07/04/2014 15:19    EKG: sinus, no ST or TW abnormalities, no comparison  Physical Exam: Blood pressure 96/63, pulse 75, temperature 98 F (36.7 C), temperature source Oral, resp. rate 16, height 5\' 4"  (1.626 m), weight 116.121 kg (256 lb), SpO2 98 %. General: Well developed, well nourished, in no acute distress. Obese Head: Normocephalic, atraumatic, sclera non-icteric,nares are without discharge. Neck: Supple. Negative for carotid bruits. JVD not elevated. Lungs: Clear bilaterally to auscultation without wheezes, rales, or rhonchi. Breathing is unlabored. Tender to palpation along left mid axillary line. Heart: RRR with S1 S2. No murmurs, rubs, or gallops appreciated. Abdomen: Soft, non-tender, non-distended with normoactive  bowel sounds. No hepatomegaly. No rebound/guarding. No obvious abdominal masses. Msk:  Strength and tone appear normal for age. Extremities: No clubbing or cyanosis. No pitting edema.  Distal pedal pulses are 2+ and equal bilaterally. Neuro: Alert and oriented X 3. Moves all extremities spontaneously. Psych:  Responds to questions appropriately with a normal affect.    Problem List 1. Chest pain, typical and atypical 2. DM2 3. Obesity 4. Pre-syncope assoc with bradycardia in setting of nausea, c/w vasovagal 5. HTN 6. Hyperlipidemia 7. Fibromylagia   Assessment and Plan:  64 y.o. female w/ PMHx significant for DM2, HTN, hyperlipidemia, TIA, remote PE assoc with OCP, sarcoidosis who presented to [facility] on 07/04/2014 with complaints of chest pain. She has two types of chest pain. The 1 week, left sided, palpable, responsive to prednisone chest pain is non-cardiac and may be costochrondritis (rather atypical as it is not at cartilage) vs. MSK vs. Fibromylagia. Can be treated with hot/cold pads, tylenol etc.  The central chest pain is a bit more concerning given her risk factors. Encouragingly, she has had only one episode, self resolved. Completely normal EKG and negative initial troponin. Anticpate if troponins remain negative and she remains chest pain free, non-invasive risk stratification can be performed with stress test. Due inability to exercise and difficulties in the past with presumably vasodilator MPI, will discuss with cardiology alternative modalities (dobutamine echo/MRI, CT angio). Again, NOT allergic to contrast dye.  Risk factors to be reduced with statin, ACEI, ASA.  Her vasovagal sounding syncopal event appears to be consistent with vasovagal syncope. Would avoid excessive vasodilators (nitropaste) and maintain on telemetry.   Summary of Recs: -aspirin 81 mg qday -continue statin, check fasting lipids, continue  -keep NPO at midnight in anticipation of stress  test, hold metformin -okay to remove nitro-paste, use PRN nitro for 2nd chest pain (central chest, gripping) -no need for anticoagulation unless biomarkers turn positive, recurrent chest pain, EKG changes   Thank you for this consult. Please call with questions. Will follow up in the AM. Signed, Lindie Roberson C. MD 07/04/2014, 5:55 PM

## 2014-07-05 DIAGNOSIS — R079 Chest pain, unspecified: Secondary | ICD-10-CM | POA: Insufficient documentation

## 2014-07-05 DIAGNOSIS — E785 Hyperlipidemia, unspecified: Secondary | ICD-10-CM

## 2014-07-05 DIAGNOSIS — I10 Essential (primary) hypertension: Secondary | ICD-10-CM

## 2014-07-05 DIAGNOSIS — Z6841 Body Mass Index (BMI) 40.0 and over, adult: Secondary | ICD-10-CM

## 2014-07-05 LAB — LIPID PANEL
CHOLESTEROL: 147 mg/dL (ref 0–200)
HDL: 53 mg/dL (ref >39)
LDL Cholesterol: 77 mg/dL (ref 0–99)
Total CHOL/HDL Ratio: 2.8 RATIO
Triglycerides: 84 mg/dL (ref <150)
VLDL: 17 mg/dL (ref 0–40)

## 2014-07-05 LAB — TSH: TSH: 1.025 u[IU]/mL (ref 0.350–4.500)

## 2014-07-05 MED ORDER — ENOXAPARIN SODIUM 60 MG/0.6ML ~~LOC~~ SOLN: 55.0000 mg | SUBCUTANEOUS | Status: DC

## 2014-07-05 MED ORDER — FAMOTIDINE 20 MG PO TABS: 20.0000 mg | ORAL_TABLET | Freq: Two times a day (BID) | ORAL | Status: DC

## 2014-07-05 NOTE — Progress Notes (Signed)
UR completed 

## 2014-07-05 NOTE — Progress Notes (Signed)
Subjective:   HPI: She reports that 1 week ago she had diffuse left sided chest wall tenderness (previously told it was costochondritis) and was started on prednisone by her PCP. 6 day course and she reports relief with this until today when symptoms returned as she completed the 6 day course. Today, however, she experienced a different type of chest pain. Central chest, "grabbing sensation", associated with shortness of breath but no diaphoresis, nausea. Occurred at rest (her mobility is significantly limited) and lasted 2-3 minutes before resolving on its own. Never had pain like this before. Not like her fibromylagia pain. No chest pain currently.  Objective:  Vital Signs in the last 24 hours: Temp:  [97.6 F (36.4 C)-98.2 F (36.8 C)] 98.2 F (36.8 C) (01/11 0649) Pulse Rate:  [44-123] 65 (01/11 0800) Resp:  [14-23] 19 (01/11 0800) BP: (85-141)/(46-75) 118/69 mmHg (01/11 0800) SpO2:  [89 %-100 %] 99 % (01/11 0800) Weight:  [256 lb (116.121 kg)] 256 lb (116.121 kg) (01/10 1338)  Intake/Output from previous day:     Physical Exam: General: Well developed, well nourished, in no acute distress. Head:  Normocephalic and atraumatic. Lungs: Clear to auscultation and percussion. Heart: Normal S1 and S2.  No murmur, rubs or gallops.  Abdomen: soft, non-tender, positive bowel sounds. obese Extremities: No clubbing or cyanosis. No edema. Neurologic: Alert and oriented x 3.    Lab Results:  Recent Labs  07/04/14 1444  WBC 6.0  HGB 12.2  PLT 263    Recent Labs  07/04/14 1444  NA 139  K 3.6  CL 103  CO2 27  GLUCOSE 80  BUN 10  CREATININE 0.63   Recent Labs  07/04/14 1444  PROT 6.8  ALBUMIN 3.5  AST 17  ALT 17  ALKPHOS 66  BILITOT 1.0    Recent Labs  07/05/14 0230  CHOL 147   Imaging: Dg Chest Portable 1 View  07/04/2014   CLINICAL DATA:  Midsternal chest pain and shortness of breath.  EXAM: PORTABLE CHEST - 1 VIEW  COMPARISON:  03/18/2014  FINDINGS:  There is elevation of the right diaphragm. There is no focal parenchymal opacity, pleural effusion, or pneumothorax. The heart and mediastinal contours are unremarkable.  The osseous structures are unremarkable.  IMPRESSION: No active disease.   Electronically Signed   By: Kathreen Devoid   On: 07/04/2014 15:19   Personally viewed.   Telemetry: No VT Personally viewed.   EKG:  No ST changes  Cardiac Studies:  Prior stress ECHO normal 6 years ago she states at [place].  Assessment/Plan:   64 year old with fibromyalgia, morbid obesity, DM, HL, remote PE, TIA, with chest pain.  Chest pain  - refuses to go forward with NUC stress. Syncope last time she tried?   - refuses cardiac cath  - She will however allow [place] to perform exercise stress echo on treadmill. Will order and try to get best information that we can understanding that obesity and knee pain may limit study.   - NPO  - D dimer normal (history of PE)  - trop normal  - ECG and CXR normal   DM  - no change in reg  HTN  - stable  HL  - statin  Vasovagal episode  - avoid NTG  - had episode in ER after acute nausea  - associate bradycardic response.   Morbid obesity  - weight loss.   If stress test low risk, OK for DC today. To follow  up with PCP.   Adalis Gatti, [place] 07/05/2014, 9:16 AM

## 2014-07-05 NOTE — ED Notes (Signed)
Pt washed up at the sink and put on new gown and blue paper scrub bottoms.

## 2014-07-05 NOTE — Progress Notes (Signed)
*  PRELIMINARY RESULTS* Echocardiogram Echocardiogram Stress Test has been performed.  Leavy Cella 07/05/2014, 11:59 AM

## 2014-07-05 NOTE — Discharge Summary (Signed)
Physician Discharge Summary  Samantha [name] PIR:[id number] DOB: [birth year] DOA: 07/04/2014  PCP: Yong Channel, MD  Admit date: 07/04/2014 Discharge date: 07/05/2014  Time spent: 35 minutes  Recommendations for Outpatient Follow-up:  1. Continue to modify risk factors- normal stress echo 2. Consider GI eval if CP continues/returns  Discharge Diagnoses:  Active Problems:   Chest pain   Sarcoidosis   HTN (hypertension)   HLD (hyperlipidemia)   Morbid obesity with BMI of 40.0-44.9, adult   Discharge Condition: improved  Diet recommendation: cardiac/diabetic  Filed Weights   07/04/14 1338  Weight: 116.121 kg (256 lb)    History of present illness:  Terris Bodin is a 64 y.o. female with h/o htn/hld/obesity/remote PE (due to contraception use)/ past smoker sent to ED by EMS due to sudden onset of substernal chest pain this am. The pain was a grabbing type pain in the center of her chest that lasted 2-3 minutes. The patient called EMS. Her pain resolved but since she has been in the emergency room she has noted some tightness again on the left side of her chest. She received morphine for pain but developed nausea and brief bradycardia, thought was related to morphine. Patient currently asymptomatic, but very anxious. Patient reported has been having left sided chest pain, thought was related to sarcoid flare up, PCP started her on a course of steroid which she has just finish taking. She reported recenly started a water aerobic class for fibromyalgia. Her first class was Friday 1/8. Patient otherwise, denies sob, no doe, no fever, no cough, no ab pain, no diarrhea, does has chronic intermittent left lower extremity edema related left knee surgeryx2, have been on lasix twice a week. Reported walk with a cane due to knee and back problem, on chronic prn pain meds.  Patient also reported h/o an adverse reaction to pharmacological stress test 21yrs ago, was admitted to [facility] at the  time, not able to provide further detail  Hospital Course:  Chest pain - D dimer normal (history of PE) - trop normal - ECG and CXR normal -normal stress echo   DM -continue home meds  HTN - stable  HL - statin  Vasovagal episode - avoid NTG - had episode in ER after acute nausea - associate bradycardic response.   Morbid obesity - weight loss.   Procedures:  Stress echo  Consultations:   cardiology  Discharge Exam: Filed Vitals:   07/05/14 1224  BP: 124/73  Pulse: 80  Temp:   Resp: 16    General: A+Ox3, NAD--chest pain resolved Cardiovascular: rrr Respiratory: clear  Discharge Instructions   Discharge Instructions    Diet - low sodium heart healthy    Complete by:  As directed      Diet Carb Modified    Complete by:  As directed      Increase activity slowly    Complete by:  As directed           Current Discharge Medication List    START taking these medications   Details  famotidine (PEPCID) 20 MG tablet Take 1 tablet (20 mg total) by mouth 2 (two) times daily. Qty: 60 tablet, Refills: 0      CONTINUE these medications which have NOT CHANGED   Details  aspirin 81 MG tablet Take 81 mg by mouth daily.    cholecalciferol (VITAMIN D) 1000 UNITS tablet Take 1,000 Units by mouth daily.      CRANBERRY EXTRACT PO Take 1 capsule by mouth daily.  Cyanocobalamin (B-12 PO) Take 1 tablet by mouth daily.    fish oil-omega-3 fatty acids 1000 MG capsule Take 1 g by mouth daily.     folic acid (FOLVITE) 1 MG tablet Take 1 mg by mouth daily.    furosemide (LASIX) 40 MG tablet Take 40 mg by mouth 2 (two) times a week.     HYDROcodone-acetaminophen (NORCO) 7.5-325 MG per tablet Take 1 tablet by mouth every 6 (six) hours as needed for moderate pain.    ipratropium (ATROVENT HFA) 17 MCG/ACT inhaler Inhale 2 puffs into the lungs every 6 (six) hours as needed for wheezing.    lisinopril (PRINIVIL,ZESTRIL) 5 MG tablet Take 2.5 mg by mouth  daily.     loratadine (CLARITIN) 10 MG tablet Take 10 mg by mouth daily.      LORazepam (ATIVAN) 0.5 MG tablet Take 0.5 mg by mouth 2 (two) times daily as needed for anxiety.    metFORMIN (GLUCOPHAGE) 500 MG tablet Take 500 mg by mouth daily with breakfast.    Misc Natural Products (TART CHERRY ADVANCED PO) Take 1 tablet by mouth daily.    pravastatin (PRAVACHOL) 80 MG tablet Take 80 mg by mouth daily.    Probiotic Product (PROBIOTIC DAILY) CAPS Take 1 capsule by mouth daily.    Pyridoxine HCl (B-6 PO) Take 1 tablet by mouth daily.      STOP taking these medications     predniSONE (STERAPRED UNI-PAK) 10 MG tablet        Allergies  Allergen Reactions  . Other Other (See Comments)    NUCLEAR MED CONTRAST/DYE caused cardiac arrest, pulseless, no readings at all  . Amoxicillin-Pot Clavulanate Other (See Comments)    lethargy  . Contrast Media [Iodinated Diagnostic Agents] Other (See Comments)    Patient states no reaction to iv dye, MRI dye or other dyes-EXCEPT NUCLEAR MEDICINE CONTRAST/DYE      The results of significant diagnostics from this hospitalization (including imaging, microbiology, ancillary and laboratory) are listed below for reference.    Significant Diagnostic Studies: Dg Chest Portable 1 View  07/04/2014   CLINICAL DATA:  Midsternal chest pain and shortness of breath.  EXAM: PORTABLE CHEST - 1 VIEW  COMPARISON:  03/18/2014  FINDINGS: There is elevation of the right diaphragm. There is no focal parenchymal opacity, pleural effusion, or pneumothorax. The heart and mediastinal contours are unremarkable.  The osseous structures are unremarkable.  IMPRESSION: No active disease.   Electronically Signed   By: Kathreen Devoid   On: 07/04/2014 15:19    Microbiology: No results found for this or any previous visit (from the past 240 hour(s)).   Labs: Basic Metabolic Panel:  Recent Labs Lab 07/04/14 1444  NA 139  K 3.6  CL 103  CO2 27  GLUCOSE 80  BUN 10   CREATININE 0.63  CALCIUM 9.2   Liver Function Tests:  Recent Labs Lab 07/04/14 1444  AST 17  ALT 17  ALKPHOS 66  BILITOT 1.0  PROT 6.8  ALBUMIN 3.5   No results for input(s): LIPASE, AMYLASE in the last 168 hours. No results for input(s): AMMONIA in the last 168 hours. CBC:  Recent Labs Lab 07/04/14 1444  WBC 6.0  HGB 12.2  HCT 38.0  MCV 85.8  PLT 263   Cardiac Enzymes: No results for input(s): CKTOTAL, CKMB, CKMBINDEX, TROPONINI in the last 168 hours. BNP: BNP (last 3 results) No results for input(s): PROBNP in the last 8760 hours. CBG: No results for input(s): GLUCAP  in the last 168 hours.     SignedEulogio Bear  [facility] 07/05/2014, 1:21 PM

## 2014-07-05 NOTE — Discharge Summary (Signed)
Discharge instructions reviewed with patient. Denies questions. AVS and prescription given to patient. IV and telemetry previously discontinued. Daughter en route to pick patient up.

## 2015-02-13 ENCOUNTER — Emergency Department (HOSPITAL_COMMUNITY)
Admission: EM | Admit: 2015-02-13 | Discharge: 2015-02-13 | Disposition: A | Discharge status: Home / Self Care | Payer: Medicare HMO | Attending: Emergency Medicine | Admitting: Emergency Medicine

## 2015-02-13 ENCOUNTER — Encounter (HOSPITAL_COMMUNITY): Payer: Self-pay | Admitting: *Deleted

## 2015-02-13 DIAGNOSIS — Z7982 Long term (current) use of aspirin: Secondary | ICD-10-CM | POA: Insufficient documentation

## 2015-02-13 DIAGNOSIS — Z8709 Personal history of other diseases of the respiratory system: Secondary | ICD-10-CM | POA: Diagnosis not present

## 2015-02-13 DIAGNOSIS — R42 Dizziness and giddiness: Secondary | ICD-10-CM | POA: Diagnosis not present

## 2015-02-13 DIAGNOSIS — Z8673 Personal history of transient ischemic attack (TIA), and cerebral infarction without residual deficits: Secondary | ICD-10-CM | POA: Insufficient documentation

## 2015-02-13 DIAGNOSIS — Z8739 Personal history of other diseases of the musculoskeletal system and connective tissue: Secondary | ICD-10-CM | POA: Diagnosis not present

## 2015-02-13 DIAGNOSIS — E785 Hyperlipidemia, unspecified: Secondary | ICD-10-CM | POA: Diagnosis not present

## 2015-02-13 DIAGNOSIS — Z86711 Personal history of pulmonary embolism: Secondary | ICD-10-CM | POA: Insufficient documentation

## 2015-02-13 DIAGNOSIS — R110 Nausea: Secondary | ICD-10-CM | POA: Insufficient documentation

## 2015-02-13 DIAGNOSIS — Z87891 Personal history of nicotine dependence: Secondary | ICD-10-CM | POA: Diagnosis not present

## 2015-02-13 DIAGNOSIS — Z862 Personal history of diseases of the blood and blood-forming organs and certain disorders involving the immune mechanism: Secondary | ICD-10-CM | POA: Insufficient documentation

## 2015-02-13 DIAGNOSIS — Z880 Allergy status to penicillin: Secondary | ICD-10-CM | POA: Diagnosis not present

## 2015-02-13 DIAGNOSIS — E119 Type 2 diabetes mellitus without complications: Secondary | ICD-10-CM | POA: Insufficient documentation

## 2015-02-13 DIAGNOSIS — Z79899 Other long term (current) drug therapy: Secondary | ICD-10-CM | POA: Diagnosis not present

## 2015-02-13 DIAGNOSIS — I10 Essential (primary) hypertension: Secondary | ICD-10-CM | POA: Insufficient documentation

## 2015-02-13 LAB — BASIC METABOLIC PANEL
ANION GAP: 10 (ref 5–15)
BUN: 13 mg/dL (ref 6–20)
CO2: 26 mmol/L (ref 22–32)
Calcium: 9.9 mg/dL (ref 8.9–10.3)
Chloride: 100 mmol/L — ABNORMAL LOW (ref 101–111)
Creatinine, Ser: 0.68 mg/dL (ref 0.44–1.00)
GFR calc Af Amer: >60 mL/min (ref >60)
GFR calc non Af Amer: >60 mL/min (ref >60)
Glucose, Bld: 130 mg/dL — ABNORMAL HIGH (ref 65–99)
Potassium: 3.7 mmol/L (ref 3.5–5.1)
SODIUM: 136 mmol/L (ref 135–145)

## 2015-02-13 LAB — URINALYSIS, ROUTINE W REFLEX MICROSCOPIC
Bilirubin Urine: NEGATIVE
GLUCOSE, UA: NEGATIVE mg/dL
Hgb urine dipstick: NEGATIVE
KETONES UR: NEGATIVE mg/dL
LEUKOCYTES UA: NEGATIVE
Nitrite: NEGATIVE
PROTEIN: NEGATIVE mg/dL
Specific Gravity, Urine: 1.019 (ref 1.005–1.030)
UROBILINOGEN UA: 1 mg/dL (ref 0.0–1.0)
pH: 6.5 (ref 5.0–8.0)

## 2015-02-13 LAB — CBC
HCT: 41.3 % (ref 36.0–46.0)
HEMOGLOBIN: 13.6 g/dL (ref 12.0–15.0)
MCH: 28.5 pg (ref 26.0–34.0)
MCHC: 32.9 g/dL (ref 30.0–36.0)
MCV: 86.4 fL (ref 78.0–100.0)
PLATELETS: 250 10*3/uL (ref 150–400)
RBC: 4.78 MIL/uL (ref 3.87–5.11)
RDW: 13.4 % (ref 11.5–15.5)
WBC: 5 10*3/uL (ref 4.0–10.5)

## 2015-02-13 NOTE — ED Provider Notes (Signed)
CSN: [id number]     Arrival date & time 02/13/15  1754 History   First MD Initiated Contact with Patient 02/13/15 1837     Chief Complaint  Patient presents with  . Dizziness  . Nausea   HPI   64 year old female presents today with multiple episodes of nausea and dizziness. Patient reports she began taking Cymbalta 5 days ago. She reports that she took the Cymbalta shortly followed by her daily aspirin. She notes shortly after taking the aspirin she had an episode of nausea and GI upset, denies any vomiting. She reports doing the same the next day with similar results. The following 2 days she took the Cymbalta, but did not take her 81 mg of aspirin; patient notes that she did not have any of the nausea, dizziness, or lightheadedness. Patient reports that she took her Cymbalta today, was on her toilet using the restroom and felt dizzy as if she was going to pass out. Patient did not pass out. She notes the symptoms did not persist at this time my evaluation were not present. Patient's concern that the Cymbalta is interacting with the aspirin that she is taking, as she is read the package insert and this is reported side effect per patient. Patient denies any fever, , chills, chest pain, shortness of breath, heart palpitations, diaphoresis, focal neurological strength deficits.  Past Medical History  Diagnosis Date  . Diabetes mellitus   . Hypertension   . Hyperlipemia   . Rhinitis   . TIA (transient ischemic attack)     x 2  . Sarcoidosis of lung   . Fibromyalgia   . Anxiety   . Depressed   . TIA (transient ischemic attack)   . PE (pulmonary embolism)    Past Surgical History  Procedure Laterality Date  . Joint replacement    . Cesarean section      x 2  . Bunionectomy    . Abdominal hysterectomy    . Hysteroscopy    . Lumbar laminectomy     No family history on file. Social History  Substance Use Topics  . Smoking status: Former Research scientist (life sciences)  . Smokeless tobacco: None  . Alcohol  Use: No   OB History    No data available     Review of Systems  All other systems reviewed and are negative.     Allergies  Other; Amoxicillin-pot clavulanate; and Contrast media  Home Medications   Prior to Admission medications   Medication Sig Start Date End Date Taking? Authorizing Provider  aspirin 81 MG tablet Take 81 mg by mouth daily.    Historical Provider, MD  cholecalciferol (VITAMIN D) 1000 UNITS tablet Take 1,000 Units by mouth daily.      Historical Provider, MD  CRANBERRY EXTRACT PO Take 1 capsule by mouth daily.    Historical Provider, MD  Cyanocobalamin (B-12 PO) Take 1 tablet by mouth daily.    Historical Provider, MD  famotidine (PEPCID) 20 MG tablet Take 1 tablet (20 mg total) by mouth 2 (two) times daily. 07/05/14   Geradine Girt, DO  fish oil-omega-3 fatty acids 1000 MG capsule Take 1 g by mouth daily.     Historical Provider, MD  folic acid (FOLVITE) 1 MG tablet Take 1 mg by mouth daily.    Historical Provider, MD  furosemide (LASIX) 40 MG tablet Take 40 mg by mouth 2 (two) times a week.     Historical Provider, MD  HYDROcodone-acetaminophen (NORCO) 7.5-325 MG per tablet Take  1 tablet by mouth every 6 (six) hours as needed for moderate pain.    Historical Provider, MD  ipratropium (ATROVENT HFA) 17 MCG/ACT inhaler Inhale 2 puffs into the lungs every 6 (six) hours as needed for wheezing.    Historical Provider, MD  lisinopril (PRINIVIL,ZESTRIL) 5 MG tablet Take 2.5 mg by mouth daily.     Historical Provider, MD  loratadine (CLARITIN) 10 MG tablet Take 10 mg by mouth daily.      Historical Provider, MD  LORazepam (ATIVAN) 0.5 MG tablet Take 0.5 mg by mouth 2 (two) times daily as needed for anxiety.    Historical Provider, MD  metFORMIN (GLUCOPHAGE) 500 MG tablet Take 500 mg by mouth daily with breakfast.    Historical Provider, MD  Misc Natural Products (TART CHERRY ADVANCED PO) Take 1 tablet by mouth daily.    Historical Provider, MD  pravastatin (PRAVACHOL)  80 MG tablet Take 80 mg by mouth daily.    Historical Provider, MD  Probiotic Product (PROBIOTIC DAILY) CAPS Take 1 capsule by mouth daily.    Historical Provider, MD  Pyridoxine HCl (B-6 PO) Take 1 tablet by mouth daily.    Historical Provider, MD   BP 128/72 mmHg  Pulse 82  Temp(Src) 98.3 F (36.8 C) (Oral)  Resp 17  SpO2 100% Physical Exam  Constitutional: She is oriented to person, place, and time. She appears well-developed and well-nourished.  HENT:  Head: Normocephalic and atraumatic.  Eyes: Conjunctivae are normal. Pupils are equal, round, and reactive to light. Right eye exhibits no discharge. Left eye exhibits no discharge. No scleral icterus.  Neck: Normal range of motion. No JVD present. No tracheal deviation present.  Pulmonary/Chest: Effort normal. No stridor.  Abdominal: Soft. She exhibits no distension and no mass. There is no tenderness. There is no rebound and no guarding.  Musculoskeletal: Normal range of motion. She exhibits no edema or tenderness.  Neurological: She is alert and oriented to person, place, and time. Coordination normal.  Skin: Skin is warm and dry. No rash noted. No erythema. No pallor.  Psychiatric: She has a normal mood and affect. Her behavior is normal. Judgment and thought content normal.  Nursing note and vitals reviewed.   ED Course  Procedures (including critical care time) Labs Review Labs Reviewed  BASIC METABOLIC PANEL - Abnormal; Notable for the following:    Chloride 100 (*)    Glucose, Bld 130 (*)    All other components within normal limits  CBC  URINALYSIS, ROUTINE W REFLEX MICROSCOPIC (NOT AT [facility])    Imaging Review No results found. I have personally reviewed and evaluated these images and lab results as part of my medical decision-making.   EKG Interpretation   Date/Time:  Sunday February 13 2015 18:24:16 EDT Ventricular Rate:  96 PR Interval:  168 QRS Duration: 74 QT Interval:  352 QTC Calculation: 444 R Axis:    91 Text Interpretation:  Normal sinus rhythm Rightward axis Borderline ECG q  waves in 3,  Since last tracing q waves now present Confirmed by MILLER   MD, BRIAN ([id number]) on 02/13/2015 6:31:11 PM      MDM   Final diagnoses:  Nausea    Labs: Urinalysis, BMP, CBC no significant findings  Imaging:  Consults:  Therapeutics:  Discharge Meds:   Assessment/Plan: Patient presents with nausea and dizziness. Patient has been able to isolate her symptoms in relation to taking new medication of Cymbalta with aspirin. Patient is encouraged to avoid taking these medications  together, contact her primary care provider and inform them of her visit today and all relevant data. She is encouraged to monitor for new or worsening signs or symptoms and return immediately if any present.         Okey Regal, PA-C 02/15/15 0139  Lacretia Leigh, MD 02/15/15 [phone]

## 2015-02-13 NOTE — Discharge Instructions (Signed)
Nausea, Adult Nausea is the feeling that you have an upset stomach or have to vomit. Nausea by itself is not likely a serious concern, but it may be an early sign of more serious medical problems. As nausea gets worse, it can lead to vomiting. If vomiting develops, there is the risk of dehydration.  CAUSES   Viral infections.  Food poisoning.  Medicines.  Pregnancy.  Motion sickness.  Migraine headaches.  Emotional distress.  Severe pain from any source.  Alcohol intoxication. HOME CARE INSTRUCTIONS  Get plenty of rest.  Ask your caregiver about specific rehydration instructions.  Eat small amounts of food and sip liquids more often.  Take all medicines as told by your caregiver. SEEK MEDICAL CARE IF:  You have not improved after 2 days, or you get worse.  You have a headache. SEEK IMMEDIATE MEDICAL CARE IF:   You have a fever.  You faint.  You keep vomiting or have blood in your vomit.  You are extremely weak or dehydrated.  You have dark or bloody stools.  You have severe chest or abdominal pain. MAKE SURE YOU:  Understand these instructions.  Will watch your condition.  Will get help right away if you are not doing well or get worse. Document Released: 07/19/2004 Document Revised: 03/05/2012 Document Reviewed: 02/21/2011 [facility] Patient Information 2015 [place], [place]. This information is not intended to replace advice given to you by your health care provider. Make sure you discuss any questions you have with your health care provider.  Please monitor for new or worsening signs or symptoms. Please contact her primary care provider inform them of your visit and all relevant data.

## 2015-02-13 NOTE — ED Notes (Signed)
Pt states she started Cymbalta Wednesday night. Pt says the leaflet advised her not to take NSAIDS with it but she takes 81 mg ASA daily. States that after she takes her medicine at bedtime she experiences nausea/lightheadedness and abd pain. Pt is unsure if there is a correlation.

## 2015-02-13 NOTE — ED Notes (Signed)
pts vital signs updated. Pt getting dressed awaiting discharge paperwork at bedside.

## 2015-08-17 ENCOUNTER — Emergency Department (HOSPITAL_COMMUNITY)
Admission: EM | Admit: 2015-08-17 | Discharge: 2015-08-17 | Disposition: A | Discharge status: Home / Self Care | Payer: Medicare HMO | Attending: Emergency Medicine | Admitting: Emergency Medicine

## 2015-08-17 ENCOUNTER — Emergency Department (EMERGENCY_DEPARTMENT_HOSPITAL)
Admit: 2015-08-17 | Discharge: 2015-08-17 | Disposition: A | Discharge status: Home / Self Care | Payer: Medicare HMO | Attending: Emergency Medicine | Admitting: Emergency Medicine

## 2015-08-17 ENCOUNTER — Encounter (HOSPITAL_COMMUNITY): Payer: Self-pay | Admitting: Cardiology

## 2015-08-17 ENCOUNTER — Emergency Department (HOSPITAL_COMMUNITY): Payer: Medicare HMO

## 2015-08-17 DIAGNOSIS — E785 Hyperlipidemia, unspecified: Secondary | ICD-10-CM | POA: Insufficient documentation

## 2015-08-17 DIAGNOSIS — Z79899 Other long term (current) drug therapy: Secondary | ICD-10-CM | POA: Insufficient documentation

## 2015-08-17 DIAGNOSIS — N390 Urinary tract infection, site not specified: Secondary | ICD-10-CM

## 2015-08-17 DIAGNOSIS — E119 Type 2 diabetes mellitus without complications: Secondary | ICD-10-CM | POA: Insufficient documentation

## 2015-08-17 DIAGNOSIS — Z8673 Personal history of transient ischemic attack (TIA), and cerebral infarction without residual deficits: Secondary | ICD-10-CM | POA: Insufficient documentation

## 2015-08-17 DIAGNOSIS — F419 Anxiety disorder, unspecified: Secondary | ICD-10-CM | POA: Diagnosis not present

## 2015-08-17 DIAGNOSIS — Z7984 Long term (current) use of oral hypoglycemic drugs: Secondary | ICD-10-CM | POA: Diagnosis not present

## 2015-08-17 DIAGNOSIS — Z7982 Long term (current) use of aspirin: Secondary | ICD-10-CM | POA: Diagnosis not present

## 2015-08-17 DIAGNOSIS — M7989 Other specified soft tissue disorders: Secondary | ICD-10-CM

## 2015-08-17 DIAGNOSIS — Z880 Allergy status to penicillin: Secondary | ICD-10-CM | POA: Insufficient documentation

## 2015-08-17 DIAGNOSIS — R55 Syncope and collapse: Secondary | ICD-10-CM | POA: Diagnosis present

## 2015-08-17 DIAGNOSIS — Z87891 Personal history of nicotine dependence: Secondary | ICD-10-CM | POA: Diagnosis not present

## 2015-08-17 DIAGNOSIS — R809 Proteinuria, unspecified: Secondary | ICD-10-CM | POA: Insufficient documentation

## 2015-08-17 DIAGNOSIS — Z86711 Personal history of pulmonary embolism: Secondary | ICD-10-CM | POA: Diagnosis not present

## 2015-08-17 DIAGNOSIS — I10 Essential (primary) hypertension: Secondary | ICD-10-CM | POA: Insufficient documentation

## 2015-08-17 DIAGNOSIS — M797 Fibromyalgia: Secondary | ICD-10-CM | POA: Insufficient documentation

## 2015-08-17 LAB — BASIC METABOLIC PANEL
Anion gap: 11 (ref 5–15)
BUN: 13 mg/dL (ref 6–20)
CALCIUM: 9.6 mg/dL (ref 8.9–10.3)
CO2: 23 mmol/L (ref 22–32)
CREATININE: 0.65 mg/dL (ref 0.44–1.00)
Chloride: 104 mmol/L (ref 101–111)
GFR calc non Af Amer: >60 mL/min (ref >60)
GLUCOSE: 126 mg/dL — AB (ref 65–99)
Potassium: 4.3 mmol/L (ref 3.5–5.1)
Sodium: 138 mmol/L (ref 135–145)

## 2015-08-17 LAB — URINE MICROSCOPIC-ADD ON: RBC / HPF: NONE SEEN RBC/hpf (ref 0–5)

## 2015-08-17 LAB — URINALYSIS, ROUTINE W REFLEX MICROSCOPIC
BILIRUBIN URINE: NEGATIVE
Glucose, UA: NEGATIVE mg/dL
HGB URINE DIPSTICK: NEGATIVE
Ketones, ur: NEGATIVE mg/dL
Leukocytes, UA: NEGATIVE
Nitrite: NEGATIVE
PROTEIN: 100 mg/dL — AB
Specific Gravity, Urine: 1.017 (ref 1.005–1.030)
pH: 7 (ref 5.0–8.0)

## 2015-08-17 LAB — CBC
HCT: 42 % (ref 36.0–46.0)
Hemoglobin: 13.5 g/dL (ref 12.0–15.0)
MCH: 28.8 pg (ref 26.0–34.0)
MCHC: 32.1 g/dL (ref 30.0–36.0)
MCV: 89.7 fL (ref 78.0–100.0)
PLATELETS: 211 10*3/uL (ref 150–400)
RBC: 4.68 MIL/uL (ref 3.87–5.11)
RDW: 13.5 % (ref 11.5–15.5)
WBC: 5.1 10*3/uL (ref 4.0–10.5)

## 2015-08-17 LAB — TROPONIN I: Troponin I: <0.03 ng/mL (ref <0.031)

## 2015-08-17 LAB — CBG MONITORING, ED: GLUCOSE-CAPILLARY: 110 mg/dL — AB (ref 65–99)

## 2015-08-17 MED ORDER — CIPROFLOXACIN HCL 500 MG PO TABS: 500.0000 mg | ORAL_TABLET | Freq: Two times a day (BID) | ORAL | Status: DC

## 2015-08-17 MED ORDER — CIPROFLOXACIN HCL 500 MG PO TABS
500.0000 mg | ORAL_TABLET | Freq: Once | ORAL | Status: AC
  Administered 2015-08-17: 500 mg via ORAL
  Filled 2015-08-17: qty 1

## 2015-08-17 MED ORDER — ACETAMINOPHEN 500 MG PO TABS
1000.0000 mg | ORAL_TABLET | Freq: Once | ORAL | Status: AC
  Administered 2015-08-17: 1000 mg via ORAL
  Filled 2015-08-17: qty 2

## 2015-08-17 NOTE — ED Notes (Signed)
Pt reports that she had a syncopal episode this morning while she was in the bathroom. States before she passed out she felt weak and clammy. Brought in via EMS.

## 2015-08-17 NOTE — ED Notes (Signed)
Pt reports "flushed feeling"  When urge to urinate occurred.  Pt reports this is similar to what happened this AM.  MD made aware.

## 2015-08-17 NOTE — ED Notes (Signed)
Pt CBG, 110.

## 2015-08-17 NOTE — ED Provider Notes (Addendum)
CSN: [id number]     Arrival date & time 08/17/15  0848 History   First MD Initiated Contact with Patient 08/17/15 1009     Chief Complaint  Patient presents with  . Loss of Consciousness     (Consider location/radiation/quality/duration/timing/severity/associated sxs/prior Treatment) HPI  The patient is a 65 year old female, she presents after having a syncopal event at home. According to the patient she has had the syncopal events in the past. She reports that she has had a recent long trip to [place] where she drove 8 hours each way, she did not take breaks as she should've, she does have a history of a blood clot, pulmonary embolism, is not currently on blood thinners. She reports chronic swelling of her left lower extremity after having knee replacement surgery in the past. Overall since her trip a couple of days ago she has felt under the weather but is nonspecific with what that means. She has spinal stenosis and has had chronic pain in her back and her legs seems to be worse the last couple of days. This morning she did not feel short of breath, no chest pain, no palpitations, no headache, no blurred vision, no numbness or weakness. She ambulated to the bathroom, she tried to get on the commode, she does not rub or anything after that. Her mother who lives with her found her face down, buttocks up with unresponsiveness and groaning. This lasted for a couple of minutes and then resolved. The patient got back to normal, was able to follow commands, was able to ambulate and at this time feels back to normal. She does not have any symptoms at this time, she has been able to answer all my questions appropriately, she has not had anything to eat today. She arrived by and noticed transport.  Past Medical History  Diagnosis Date  . Diabetes mellitus   . Hypertension   . Hyperlipemia   . Rhinitis   . TIA (transient ischemic attack)     x 2  . Sarcoidosis of lung ([place])   . Fibromyalgia   .  Anxiety   . Depressed   . TIA (transient ischemic attack)   . PE (pulmonary embolism)    Past Surgical History  Procedure Laterality Date  . Joint replacement    . Cesarean section      x 2  . Bunionectomy    . Abdominal hysterectomy    . Hysteroscopy    . Lumbar laminectomy     History reviewed. No pertinent family history. Social History  Substance Use Topics  . Smoking status: Former Research scientist (life sciences)  . Smokeless tobacco: None  . Alcohol Use: No   OB History    No data available     Review of Systems  All other systems reviewed and are negative.     Allergies  Other; Amoxicillin-pot clavulanate; and Contrast media  Home Medications   Prior to Admission medications   Medication Sig Start Date End Date Taking? Authorizing Provider  aspirin 81 MG tablet Take 81 mg by mouth daily.   Yes Historical Provider, MD  cholecalciferol (VITAMIN D) 1000 UNITS tablet Take 1,000 Units by mouth daily.     Yes Historical Provider, MD  CRANBERRY EXTRACT PO Take 1 capsule by mouth daily.   Yes Historical Provider, MD  Cyanocobalamin (B-12 PO) Take 1 tablet by mouth daily.   Yes Historical Provider, MD  DULoxetine (CYMBALTA) 30 MG capsule Take 30 mg by mouth 2 (two) times daily. 07/13/15  Yes Historical Provider, MD  fish oil-omega-3 fatty acids 1000 MG capsule Take 1 g by mouth daily.    Yes Historical Provider, MD  folic acid (FOLVITE) 1 MG tablet Take 1 mg by mouth daily.   Yes Historical Provider, MD  furosemide (LASIX) 40 MG tablet Take 40 mg by mouth 2 (two) times a week.    Yes Historical Provider, MD  gabapentin (NEURONTIN) 300 MG capsule Take 300 mg by mouth 3 (three) times daily. 05/12/15  Yes Historical Provider, MD  HYDROcodone-acetaminophen (NORCO) 7.5-325 MG per tablet Take 1 tablet by mouth every 6 (six) hours as needed for moderate pain.   Yes Historical Provider, MD  ipratropium (ATROVENT HFA) 17 MCG/ACT inhaler Inhale 2 puffs into the lungs every 6 (six) hours as needed for  wheezing.   Yes Historical Provider, MD  lisinopril (PRINIVIL,ZESTRIL) 5 MG tablet Take 2.5 mg by mouth daily.    Yes Historical Provider, MD  loratadine (CLARITIN) 10 MG tablet Take 10 mg by mouth daily.     Yes Historical Provider, MD  metFORMIN (GLUCOPHAGE) 500 MG tablet Take 500 mg by mouth daily with breakfast.   Yes Historical Provider, MD  Misc Natural Products (TART CHERRY ADVANCED PO) Take 1 tablet by mouth daily.   Yes Historical Provider, MD  pravastatin (PRAVACHOL) 80 MG tablet Take 80 mg by mouth daily.   Yes Historical Provider, MD  Probiotic Product (PROBIOTIC DAILY) CAPS Take 1 capsule by mouth daily.   Yes Historical Provider, MD  Pyridoxine HCl (B-6 PO) Take 1 tablet by mouth daily.   Yes Historical Provider, MD  ciprofloxacin (CIPRO) 500 MG tablet Take 1 tablet (500 mg total) by mouth 2 (two) times daily. 08/17/15   Noemi Chapel, MD  famotidine (PEPCID) 20 MG tablet Take 1 tablet (20 mg total) by mouth 2 (two) times daily. Patient not taking: Reported on 08/17/2015 07/05/14   Tomi Bamberger Vann, DO   BP 124/52 mmHg  Pulse 93  Temp(Src) 98 F (36.7 C) (Oral)  Resp 17  SpO2 92% Physical Exam  Constitutional: She appears well-developed and well-nourished. No distress.  HENT:  Head: Normocephalic and atraumatic.  Mouth/Throat: Oropharynx is clear and moist. No oropharyngeal exudate.  Eyes: Conjunctivae and EOM are normal. Pupils are equal, round, and reactive to light. Right eye exhibits no discharge. Left eye exhibits no discharge. No scleral icterus.  Neck: Normal range of motion. Neck supple. No JVD present. No thyromegaly present.  Cardiovascular: Normal rate, regular rhythm, normal heart sounds and intact distal pulses.  Exam reveals no gallop and no friction rub.   No murmur heard. Pulmonary/Chest: Effort normal and breath sounds normal. No respiratory distress. She has no wheezes. She has no rales.  Abdominal: Soft. Bowel sounds are normal. She exhibits no distension and no  mass. There is no tenderness.  Musculoskeletal: Normal range of motion. She exhibits edema ( Scant asymmetrical lower extremities, left greater than right, supple joints, soft compartments). She exhibits no tenderness.  Lymphadenopathy:    She has no cervical adenopathy.  Neurological: She is alert. Coordination normal.  Skin: Skin is warm and dry. No rash noted. No erythema.  Psychiatric: She has a normal mood and affect. Her behavior is normal.  Nursing note and vitals reviewed.   ED Course  Procedures (including critical care time) Labs Review Labs Reviewed  BASIC METABOLIC PANEL - Abnormal; Notable for the following:    Glucose, Bld 126 (*)    All other components within normal limits  URINALYSIS, ROUTINE W REFLEX MICROSCOPIC (NOT AT [facility]) - Abnormal; Notable for the following:    Color, Urine AMBER (*)    Protein, ur 100 (*)    All other components within normal limits  URINE MICROSCOPIC-ADD ON - Abnormal; Notable for the following:    Squamous Epithelial / LPF 6-30 (*)    Bacteria, UA MANY (*)    Casts GRANULAR CAST (*)    All other components within normal limits  CBG MONITORING, ED - Abnormal; Notable for the following:    Glucose-Capillary 110 (*)    All other components within normal limits  URINE CULTURE  CBC  TROPONIN I    Imaging Review Dg Hand Complete Left  08/17/2015  CLINICAL DATA:  Syncopal episode today, striking hand against the tub. Dorsal pain and swelling. EXAM: LEFT HAND - COMPLETE 3+ VIEW COMPARISON:  None. FINDINGS: There is no evidence of fracture or dislocation. There is no evidence of arthropathy or other focal bone abnormality. Soft tissues are unremarkable. IMPRESSION: Negative. Electronically Signed   By: Nelson Chimes M.D.   On: 08/17/2015 11:26   I have personally reviewed and evaluated these images and lab results as part of my medical decision-making.   EKG Interpretation   Date/Time:  Wednesday August 17 2015 09:37:36 EST Ventricular  Rate:  87 PR Interval:  184 QRS Duration: 68 QT Interval:  374 QTC Calculation: 450 R Axis:   83 Text Interpretation:  Normal sinus rhythm Normal ECG since last tracing no  significant change Confirmed by Kermitt Harjo  MD, Tiler Brandis ([id number]) on 08/17/2015  10:04:47 AM      MDM   Final diagnoses:  Syncope and collapse  UTI (lower urinary tract infection)  Proteinuria    The patient has vital signs which are unremarkable, EKG which is also unremarkable, the patient is well-appearing at this time. The etiology of her syncopal event is unclear. She does have a history of pulmonary embolus but has no hypoxia tachycardia or chest pain. She does have some asymmetric swelling of the legs for which I will get a Doppler ultrasound. She has the need of checking labs including ruling out anemia or electrolyte disturbances. She will need something to eat and drink, she is totally hemodynamically normal at this time.  Filed Vitals:   08/17/15 0933  BP: 115/73  Pulse: 83  Temp: 98 F (36.7 C)  Resp: 18    Labs normal - UA leuk neg and Nitrite neg, bacteria present - VS remained normal [place] neg for DVT - preliminary Pt informed Agreeable to d/c.    Noemi Chapel, MD 08/17/15 1322  Noemi Chapel, MD 08/17/15 [phone]

## 2015-08-17 NOTE — Discharge Instructions (Signed)

## 2015-08-17 NOTE — ED Notes (Signed)
Miller MD at bedside. 

## 2015-08-17 NOTE — ED Provider Notes (Signed)
4:44 PM Outpatient pharmacist called to inform about a possible medication interaction between the Cipro prescribed today for the patient's UTI and her Cymbalta. The pt was prescribed Cipro 500 mg BID for 7 days. This medication selection, dose, and duration is consistent with the treatment of a complicated UTI. An alternative therapy for this diagnosis is Levaquin 750 mg daily for five days. This alternative was suggested to the pharmacist. Pharmacist confirmed that Aptos does not have an interaction with any of the patient's medications.   Lorayne Bender, PA-C 08/17/15 [place], MD 08/18/15 [phone]

## 2015-08-17 NOTE — Progress Notes (Signed)
*  PRELIMINARY RESULTS* Vascular Ultrasound Left lower extremity venous duplex has been completed.  Preliminary findings: No evidence of DVT or baker's cyst.   Landry Mellow, RDMS, RVT  08/17/2015, 10:58 AM

## 2015-08-18 LAB — URINE CULTURE

## 2015-12-10 DIAGNOSIS — Z79899 Other long term (current) drug therapy: Secondary | ICD-10-CM | POA: Insufficient documentation

## 2015-12-10 DIAGNOSIS — I10 Essential (primary) hypertension: Secondary | ICD-10-CM | POA: Insufficient documentation

## 2015-12-10 DIAGNOSIS — E119 Type 2 diabetes mellitus without complications: Secondary | ICD-10-CM | POA: Insufficient documentation

## 2015-12-10 DIAGNOSIS — Z87891 Personal history of nicotine dependence: Secondary | ICD-10-CM | POA: Insufficient documentation

## 2015-12-10 DIAGNOSIS — R319 Hematuria, unspecified: Secondary | ICD-10-CM | POA: Diagnosis present

## 2015-12-10 DIAGNOSIS — E785 Hyperlipidemia, unspecified: Secondary | ICD-10-CM | POA: Diagnosis not present

## 2015-12-10 DIAGNOSIS — Z7984 Long term (current) use of oral hypoglycemic drugs: Secondary | ICD-10-CM | POA: Diagnosis not present

## 2015-12-10 DIAGNOSIS — N390 Urinary tract infection, site not specified: Secondary | ICD-10-CM | POA: Diagnosis not present

## 2015-12-10 DIAGNOSIS — Z8673 Personal history of transient ischemic attack (TIA), and cerebral infarction without residual deficits: Secondary | ICD-10-CM | POA: Diagnosis not present

## 2015-12-10 DIAGNOSIS — Z7982 Long term (current) use of aspirin: Secondary | ICD-10-CM | POA: Insufficient documentation

## 2015-12-10 LAB — BASIC METABOLIC PANEL
ANION GAP: 8 (ref 5–15)
BUN: 17 mg/dL (ref 6–20)
CO2: 27 mmol/L (ref 22–32)
Calcium: 10 mg/dL (ref 8.9–10.3)
Chloride: 101 mmol/L (ref 101–111)
Creatinine, Ser: 0.73 mg/dL (ref 0.44–1.00)
GFR calc Af Amer: >60 mL/min (ref >60)
Glucose, Bld: 107 mg/dL — ABNORMAL HIGH (ref 65–99)
POTASSIUM: 4.5 mmol/L (ref 3.5–5.1)
SODIUM: 136 mmol/L (ref 135–145)

## 2015-12-10 LAB — URINALYSIS, ROUTINE W REFLEX MICROSCOPIC
Bilirubin Urine: NEGATIVE
Glucose, UA: NEGATIVE mg/dL
Ketones, ur: 15 mg/dL — AB
Nitrite: NEGATIVE
PROTEIN: 30 mg/dL — AB
SPECIFIC GRAVITY, URINE: 1.027 (ref 1.005–1.030)
pH: 6 (ref 5.0–8.0)

## 2015-12-10 LAB — CBC
HEMATOCRIT: 40.4 % (ref 36.0–46.0)
HEMOGLOBIN: 12.7 g/dL (ref 12.0–15.0)
MCH: 27.7 pg (ref 26.0–34.0)
MCHC: 31.4 g/dL (ref 30.0–36.0)
MCV: 88.2 fL (ref 78.0–100.0)
Platelets: 224 10*3/uL (ref 150–400)
RBC: 4.58 MIL/uL (ref 3.87–5.11)
RDW: 13.7 % (ref 11.5–15.5)
WBC: 5.1 10*3/uL (ref 4.0–10.5)

## 2015-12-10 LAB — URINE MICROSCOPIC-ADD ON

## 2015-12-10 NOTE — ED Notes (Signed)
Patient arrives with complaint of hematuria, malaise, dysuria, and polyuria. Onset yesterday. Patient states she began to feel fatigued but was unsure of the source. Then today she noticed some dysuria coupled with a small amount of blood when wiping. After that she noticed that at her next void the toilet water was a light shade of red.

## 2015-12-11 ENCOUNTER — Emergency Department (HOSPITAL_COMMUNITY)
Admission: EM | Admit: 2015-12-11 | Discharge: 2015-12-11 | Disposition: A | Discharge status: Home / Self Care | Payer: Medicare HMO | Attending: Emergency Medicine | Admitting: Emergency Medicine

## 2015-12-11 DIAGNOSIS — N390 Urinary tract infection, site not specified: Secondary | ICD-10-CM

## 2015-12-11 DIAGNOSIS — R319 Hematuria, unspecified: Secondary | ICD-10-CM

## 2015-12-11 MED ORDER — PHENAZOPYRIDINE HCL 100 MG PO TABS: 100.0000 mg | ORAL_TABLET | Freq: Three times a day (TID) | ORAL | Status: DC

## 2015-12-11 MED ORDER — SULFAMETHOXAZOLE-TRIMETHOPRIM 800-160 MG PO TABS
1.0000 | ORAL_TABLET | ORAL | Status: AC
  Administered 2015-12-11: 1 via ORAL
  Filled 2015-12-11: qty 1

## 2015-12-11 MED ORDER — SULFAMETHOXAZOLE-TRIMETHOPRIM 800-160 MG PO TABS: 1.0000 | ORAL_TABLET | Freq: Two times a day (BID) | ORAL | Status: DC

## 2015-12-11 MED ORDER — PHENAZOPYRIDINE HCL 100 MG PO TABS
95.0000 mg | ORAL_TABLET | ORAL | Status: AC
  Administered 2015-12-11: 100 mg via ORAL
  Filled 2015-12-11: qty 1

## 2015-12-11 NOTE — ED Provider Notes (Signed)
CSN: [id number]     Arrival date & time 12/10/15  2102 History   First MD Initiated Contact with Patient 12/11/15 0010     Chief Complaint  Patient presents with  . Hematuria     (Consider location/radiation/quality/duration/timing/severity/associated sxs/prior Treatment) HPI Comments: This 65 year old obese African-American female who reports that she started having dysuria this morning.  This has progressively gotten worse with hematuria noted on the last 2 urinations.  Denies any fever, nausea, vomiting, headache.  Patient is a 65 y.o. female presenting with hematuria. The history is provided by the patient.  Hematuria This is a new problem. The current episode started today. The problem occurs constantly. The problem has been unchanged. Associated symptoms include urinary symptoms. Pertinent negatives include no fever or headaches. Nothing aggravates the symptoms. She has tried nothing for the symptoms.    Past Medical History  Diagnosis Date  . Diabetes mellitus   . Hypertension   . Hyperlipemia   . Rhinitis   . TIA (transient ischemic attack)     x 2  . Sarcoidosis of lung ([place])   . Fibromyalgia   . Anxiety   . Depressed   . TIA (transient ischemic attack)   . PE (pulmonary embolism)    Past Surgical History  Procedure Laterality Date  . Joint replacement    . Cesarean section      x 2  . Bunionectomy    . Abdominal hysterectomy    . Hysteroscopy    . Lumbar laminectomy     No family history on file. Social History  Substance Use Topics  . Smoking status: Former Research scientist (life sciences)  . Smokeless tobacco: Not on file  . Alcohol Use: No   OB History    No data available     Review of Systems  Constitutional: Negative for fever.  Genitourinary: Positive for dysuria and hematuria.  Neurological: Negative for headaches.  All other systems reviewed and are negative.     Allergies  Other; Amoxicillin-pot clavulanate; and Contrast media  Home Medications   Prior to  Admission medications   Medication Sig Start Date End Date Taking? Authorizing Provider  aspirin 81 MG tablet Take 81 mg by mouth daily.    Historical Provider, MD  cholecalciferol (VITAMIN D) 1000 UNITS tablet Take 1,000 Units by mouth daily.      Historical Provider, MD  ciprofloxacin (CIPRO) 500 MG tablet Take 1 tablet (500 mg total) by mouth 2 (two) times daily. 08/17/15   Noemi Chapel, MD  CRANBERRY EXTRACT PO Take 1 capsule by mouth daily.    Historical Provider, MD  Cyanocobalamin (B-12 PO) Take 1 tablet by mouth daily.    Historical Provider, MD  DULoxetine (CYMBALTA) 30 MG capsule Take 30 mg by mouth 2 (two) times daily. 07/13/15   Historical Provider, MD  famotidine (PEPCID) 20 MG tablet Take 1 tablet (20 mg total) by mouth 2 (two) times daily. Patient not taking: Reported on 08/17/2015 07/05/14   Geradine Girt, DO  fish oil-omega-3 fatty acids 1000 MG capsule Take 1 g by mouth daily.     Historical Provider, MD  folic acid (FOLVITE) 1 MG tablet Take 1 mg by mouth daily.    Historical Provider, MD  furosemide (LASIX) 40 MG tablet Take 40 mg by mouth 2 (two) times a week.     Historical Provider, MD  gabapentin (NEURONTIN) 300 MG capsule Take 300 mg by mouth 3 (three) times daily. 05/12/15   Historical Provider, MD  HYDROcodone-acetaminophen [facility])  7.5-325 MG per tablet Take 1 tablet by mouth every 6 (six) hours as needed for moderate pain.    Historical Provider, MD  ipratropium (ATROVENT HFA) 17 MCG/ACT inhaler Inhale 2 puffs into the lungs every 6 (six) hours as needed for wheezing.    Historical Provider, MD  lisinopril (PRINIVIL,ZESTRIL) 5 MG tablet Take 2.5 mg by mouth daily.     Historical Provider, MD  loratadine (CLARITIN) 10 MG tablet Take 10 mg by mouth daily.      Historical Provider, MD  metFORMIN (GLUCOPHAGE) 500 MG tablet Take 500 mg by mouth daily with breakfast.    Historical Provider, MD  Misc Natural Products (TART CHERRY ADVANCED PO) Take 1 tablet by mouth daily.     Historical Provider, MD  phenazopyridine (PYRIDIUM) 100 MG tablet Take 1 tablet (100 mg total) by mouth 3 (three) times daily with meals. 12/11/15   Junius Creamer, NP  pravastatin (PRAVACHOL) 80 MG tablet Take 80 mg by mouth daily.    Historical Provider, MD  Probiotic Product (PROBIOTIC DAILY) CAPS Take 1 capsule by mouth daily.    Historical Provider, MD  Pyridoxine HCl (B-6 PO) Take 1 tablet by mouth daily.    Historical Provider, MD  sulfamethoxazole-trimethoprim (BACTRIM DS,SEPTRA DS) 800-160 MG tablet Take 1 tablet by mouth 2 (two) times daily. 12/11/15   Junius Creamer, NP   BP 125/80 mmHg  Temp(Src) 98.3 F (36.8 C) (Oral)  Resp 16  Ht 5\' 4"  (1.626 m)  Wt 118.842 kg  BMI 44.95 kg/m2  SpO2 96% Physical Exam  Constitutional: She is oriented to person, place, and time. She appears well-developed and well-nourished.  HENT:  Head: Normocephalic.  Eyes: Pupils are equal, round, and reactive to light.  Neck: Normal range of motion.  Cardiovascular: Normal rate and regular rhythm.   Pulmonary/Chest: Effort normal and breath sounds normal.  Abdominal: Soft. She exhibits no distension. There is no tenderness.  Musculoskeletal: Normal range of motion.  Neurological: She is alert and oriented to person, place, and time.  Skin: Skin is warm.  Nursing note and vitals reviewed.   ED Course  Procedures (including critical care time) Labs Review Labs Reviewed  URINALYSIS, ROUTINE W REFLEX MICROSCOPIC (NOT AT [facility]) - Abnormal; Notable for the following:    Color, Urine AMBER (*)    APPearance TURBID (*)    Hgb urine dipstick LARGE (*)    Ketones, ur 15 (*)    Protein, ur 30 (*)    Leukocytes, UA LARGE (*)    All other components within normal limits  BASIC METABOLIC PANEL - Abnormal; Notable for the following:    Glucose, Bld 107 (*)    All other components within normal limits  URINE MICROSCOPIC-ADD ON - Abnormal; Notable for the following:    Squamous Epithelial / LPF 0-5 (*)     Bacteria, UA MANY (*)    All other components within normal limits  CBC    Imaging Review No results found. I have personally reviewed and evaluated these images and lab results as part of my medical decision-making.   EKG Interpretation None    Patient will be prescribed Pyridium as well as Septra.  She will take Septra for 7 days.  Pyridium for 3 days.  She will follow-up with her PCP in approximately 10 days for a retest of her urine  MDM   Final diagnoses:  UTI (lower urinary tract infection)  Hematuria         Junius Creamer, NP  12/11/15 [place], DO 12/11/15 [id number]

## 2016-06-05 DIAGNOSIS — Z6841 Body Mass Index (BMI) 40.0 and over, adult: Secondary | ICD-10-CM | POA: Diagnosis not present

## 2016-06-05 DIAGNOSIS — E119 Type 2 diabetes mellitus without complications: Secondary | ICD-10-CM | POA: Diagnosis not present

## 2016-06-05 DIAGNOSIS — Z79899 Other long term (current) drug therapy: Secondary | ICD-10-CM | POA: Diagnosis not present

## 2016-06-05 DIAGNOSIS — I10 Essential (primary) hypertension: Secondary | ICD-10-CM | POA: Diagnosis not present

## 2016-06-05 DIAGNOSIS — Z23 Encounter for immunization: Secondary | ICD-10-CM | POA: Diagnosis not present

## 2016-06-05 DIAGNOSIS — R609 Edema, unspecified: Secondary | ICD-10-CM | POA: Diagnosis not present

## 2016-06-05 DIAGNOSIS — M1711 Unilateral primary osteoarthritis, right knee: Secondary | ICD-10-CM | POA: Diagnosis not present

## 2016-06-05 DIAGNOSIS — G629 Polyneuropathy, unspecified: Secondary | ICD-10-CM | POA: Diagnosis not present

## 2016-06-05 DIAGNOSIS — E7800 Pure hypercholesterolemia, unspecified: Secondary | ICD-10-CM | POA: Diagnosis not present

## 2016-06-08 DIAGNOSIS — Z6841 Body Mass Index (BMI) 40.0 and over, adult: Secondary | ICD-10-CM | POA: Diagnosis not present

## 2016-06-08 DIAGNOSIS — I10 Essential (primary) hypertension: Secondary | ICD-10-CM | POA: Diagnosis not present

## 2016-06-08 DIAGNOSIS — M1711 Unilateral primary osteoarthritis, right knee: Secondary | ICD-10-CM | POA: Diagnosis not present

## 2016-06-08 DIAGNOSIS — E7800 Pure hypercholesterolemia, unspecified: Secondary | ICD-10-CM | POA: Diagnosis not present

## 2016-06-08 DIAGNOSIS — G629 Polyneuropathy, unspecified: Secondary | ICD-10-CM | POA: Diagnosis not present

## 2016-06-08 DIAGNOSIS — Z79899 Other long term (current) drug therapy: Secondary | ICD-10-CM | POA: Diagnosis not present

## 2016-06-08 DIAGNOSIS — E119 Type 2 diabetes mellitus without complications: Secondary | ICD-10-CM | POA: Diagnosis not present

## 2016-06-08 DIAGNOSIS — L304 Erythema intertrigo: Secondary | ICD-10-CM | POA: Diagnosis not present

## 2016-06-08 DIAGNOSIS — R609 Edema, unspecified: Secondary | ICD-10-CM | POA: Diagnosis not present

## 2016-06-26 DIAGNOSIS — R05 Cough: Secondary | ICD-10-CM | POA: Diagnosis not present

## 2016-07-24 DIAGNOSIS — Z1231 Encounter for screening mammogram for malignant neoplasm of breast: Secondary | ICD-10-CM | POA: Diagnosis not present

## 2016-08-14 DIAGNOSIS — Z79899 Other long term (current) drug therapy: Secondary | ICD-10-CM | POA: Diagnosis not present

## 2016-08-14 DIAGNOSIS — M5442 Lumbago with sciatica, left side: Secondary | ICD-10-CM | POA: Diagnosis not present

## 2016-08-14 DIAGNOSIS — M5441 Lumbago with sciatica, right side: Secondary | ICD-10-CM | POA: Diagnosis not present

## 2016-08-14 DIAGNOSIS — G8929 Other chronic pain: Secondary | ICD-10-CM | POA: Diagnosis not present

## 2016-09-03 DIAGNOSIS — G5601 Carpal tunnel syndrome, right upper limb: Secondary | ICD-10-CM | POA: Diagnosis not present

## 2016-09-03 DIAGNOSIS — G5603 Carpal tunnel syndrome, bilateral upper limbs: Secondary | ICD-10-CM | POA: Diagnosis not present

## 2016-10-09 ENCOUNTER — Ambulatory Visit
Admission: RE | Admit: 2016-10-09 | Discharge: 2016-10-09 | Disposition: A | Discharge status: Home / Self Care | Payer: Medicare HMO | Source: Ambulatory Visit | Attending: Family Medicine | Admitting: Family Medicine

## 2016-10-09 ENCOUNTER — Other Ambulatory Visit: Payer: Self-pay | Admitting: Family Medicine

## 2016-10-09 DIAGNOSIS — R059 Cough, unspecified: Secondary | ICD-10-CM

## 2016-10-09 DIAGNOSIS — R05 Cough: Secondary | ICD-10-CM

## 2016-10-09 DIAGNOSIS — R0602 Shortness of breath: Secondary | ICD-10-CM | POA: Diagnosis not present

## 2016-10-09 MED ORDER — IOPAMIDOL (ISOVUE-370) INJECTION 76%
75.0000 mL | Freq: Once | INTRAVENOUS | Status: AC | PRN
  Administered 2016-10-09: 75 mL via INTRAVENOUS

## 2016-10-12 DIAGNOSIS — M7062 Trochanteric bursitis, left hip: Secondary | ICD-10-CM | POA: Diagnosis not present

## 2016-10-12 DIAGNOSIS — M79652 Pain in left thigh: Secondary | ICD-10-CM | POA: Diagnosis not present

## 2016-11-26 DIAGNOSIS — N390 Urinary tract infection, site not specified: Secondary | ICD-10-CM | POA: Diagnosis not present

## 2016-12-11 DIAGNOSIS — Z23 Encounter for immunization: Secondary | ICD-10-CM | POA: Diagnosis not present

## 2016-12-11 DIAGNOSIS — E7800 Pure hypercholesterolemia, unspecified: Secondary | ICD-10-CM | POA: Diagnosis not present

## 2016-12-11 DIAGNOSIS — I10 Essential (primary) hypertension: Secondary | ICD-10-CM | POA: Diagnosis not present

## 2016-12-11 DIAGNOSIS — E119 Type 2 diabetes mellitus without complications: Secondary | ICD-10-CM | POA: Diagnosis not present

## 2017-01-25 DIAGNOSIS — G5601 Carpal tunnel syndrome, right upper limb: Secondary | ICD-10-CM | POA: Diagnosis not present

## 2017-01-25 DIAGNOSIS — M25531 Pain in right wrist: Secondary | ICD-10-CM | POA: Diagnosis not present

## 2017-02-05 DIAGNOSIS — M5442 Lumbago with sciatica, left side: Secondary | ICD-10-CM | POA: Diagnosis not present

## 2017-02-05 DIAGNOSIS — G8929 Other chronic pain: Secondary | ICD-10-CM | POA: Diagnosis not present

## 2017-02-05 DIAGNOSIS — Z79899 Other long term (current) drug therapy: Secondary | ICD-10-CM | POA: Diagnosis not present

## 2017-02-14 DIAGNOSIS — M549 Dorsalgia, unspecified: Secondary | ICD-10-CM | POA: Diagnosis not present

## 2017-04-23 DIAGNOSIS — H524 Presbyopia: Secondary | ICD-10-CM | POA: Diagnosis not present

## 2017-04-23 DIAGNOSIS — H52209 Unspecified astigmatism, unspecified eye: Secondary | ICD-10-CM | POA: Diagnosis not present

## 2017-04-23 DIAGNOSIS — H5213 Myopia, bilateral: Secondary | ICD-10-CM | POA: Diagnosis not present

## 2017-05-19 DIAGNOSIS — J189 Pneumonia, unspecified organism: Secondary | ICD-10-CM | POA: Diagnosis not present

## 2017-05-30 DIAGNOSIS — J181 Lobar pneumonia, unspecified organism: Secondary | ICD-10-CM | POA: Diagnosis not present

## 2018-06-09 ENCOUNTER — Other Ambulatory Visit: Payer: Self-pay | Admitting: Family Medicine

## 2018-06-09 DIAGNOSIS — R921 Mammographic calcification found on diagnostic imaging of breast: Secondary | ICD-10-CM

## 2018-06-16 ENCOUNTER — Other Ambulatory Visit: Payer: Self-pay | Admitting: Family Medicine

## 2018-06-19 ENCOUNTER — Ambulatory Visit
Admission: RE | Admit: 2018-06-19 | Discharge: 2018-06-19 | Disposition: A | Discharge status: Home / Self Care | Payer: Medicare Other | Source: Ambulatory Visit | Attending: Family Medicine | Admitting: Family Medicine

## 2018-06-19 DIAGNOSIS — R921 Mammographic calcification found on diagnostic imaging of breast: Secondary | ICD-10-CM

## 2018-07-11 ENCOUNTER — Other Ambulatory Visit: Payer: Self-pay | Admitting: Surgery

## 2018-07-11 DIAGNOSIS — N6092 Unspecified benign mammary dysplasia of left breast: Secondary | ICD-10-CM

## 2018-07-18 ENCOUNTER — Other Ambulatory Visit: Payer: Self-pay | Admitting: Surgery

## 2018-07-18 DIAGNOSIS — N6092 Unspecified benign mammary dysplasia of left breast: Secondary | ICD-10-CM

## 2018-08-18 NOTE — Progress Notes (Signed)
Pt answered for preop phone call but states she is heading to water aerobics and requested we call back at 3:30pm today.

## 2018-08-19 ENCOUNTER — Encounter (HOSPITAL_BASED_OUTPATIENT_CLINIC_OR_DEPARTMENT_OTHER): Payer: Self-pay | Admitting: *Deleted

## 2018-08-19 ENCOUNTER — Encounter (HOSPITAL_COMMUNITY): Payer: Self-pay | Admitting: Anesthesiology

## 2018-08-19 ENCOUNTER — Other Ambulatory Visit: Payer: Self-pay

## 2018-08-19 NOTE — Progress Notes (Signed)
Bring all medications day of surgery. Will talk with Anesthesia about cough and congestion,will call pt back to get labs and Anesthesia Consult. Dr Gifford Shave reviewed history, sarcoidosis, Tia X 2, HTN, Diabetic. Pt states she can walk 500 ft before she gets short of Breath. He would like Anesthesia Consult BMI 45 and check pt for congestion and cough. Called pt she will come in tomorrow at 1pm for BMET, EKG and Anesthesia Consult.

## 2018-08-19 NOTE — Progress Notes (Signed)
Bring all medications day of surgery. Will talk with Anesthesia about cough and congestion,will call pt back to get labs and Anesthesia Consult.

## 2018-08-20 ENCOUNTER — Encounter (HOSPITAL_BASED_OUTPATIENT_CLINIC_OR_DEPARTMENT_OTHER)
Admission: RE | Admit: 2018-08-20 | Discharge: 2018-08-20 | Disposition: A | Discharge status: Home / Self Care | Payer: Medicare Other | Source: Ambulatory Visit | Attending: Surgery | Admitting: Surgery

## 2018-08-20 DIAGNOSIS — Z01818 Encounter for other preprocedural examination: Secondary | ICD-10-CM | POA: Diagnosis not present

## 2018-08-20 NOTE — Progress Notes (Signed)
Anesthesia consult per Dr. R.Fitzgerald, will cancel surgery as this time due to pt's continued cough and congestion.  Pt to see PCP on Tuesday, August 26, 2018.  Unable to obtain labs due to difficult stick, will need IV consult when surgery is rescheduled.  Notified Wendy at [employer].

## 2018-08-26 ENCOUNTER — Inpatient Hospital Stay: Admission: RE | Admit: 2018-08-26 | Payer: Medicare Other | Source: Ambulatory Visit

## 2018-08-27 ENCOUNTER — Ambulatory Visit (HOSPITAL_BASED_OUTPATIENT_CLINIC_OR_DEPARTMENT_OTHER): Admission: RE | Admit: 2018-08-27 | Payer: Medicare Other | Source: Home / Self Care | Admitting: Surgery

## 2018-08-27 SURGERY — BREAST LUMPECTOMY WITH RADIOACTIVE SEED LOCALIZATION
Anesthesia: General | Laterality: Left

## 2019-01-31 ENCOUNTER — Emergency Department (HOSPITAL_COMMUNITY)
Admission: EM | Admit: 2019-01-31 | Discharge: 2019-01-31 | Disposition: A | Discharge status: Home / Self Care | Payer: Medicare Other | Attending: Emergency Medicine | Admitting: Emergency Medicine

## 2019-01-31 ENCOUNTER — Emergency Department (HOSPITAL_COMMUNITY): Payer: Medicare Other

## 2019-01-31 ENCOUNTER — Other Ambulatory Visit: Payer: Self-pay

## 2019-01-31 DIAGNOSIS — Z20828 Contact with and (suspected) exposure to other viral communicable diseases: Secondary | ICD-10-CM | POA: Insufficient documentation

## 2019-01-31 DIAGNOSIS — R531 Weakness: Secondary | ICD-10-CM | POA: Diagnosis present

## 2019-01-31 DIAGNOSIS — Z79899 Other long term (current) drug therapy: Secondary | ICD-10-CM | POA: Diagnosis not present

## 2019-01-31 DIAGNOSIS — R0789 Other chest pain: Secondary | ICD-10-CM | POA: Diagnosis not present

## 2019-01-31 DIAGNOSIS — Z8673 Personal history of transient ischemic attack (TIA), and cerebral infarction without residual deficits: Secondary | ICD-10-CM | POA: Insufficient documentation

## 2019-01-31 DIAGNOSIS — F4320 Adjustment disorder, unspecified: Secondary | ICD-10-CM | POA: Diagnosis not present

## 2019-01-31 DIAGNOSIS — E119 Type 2 diabetes mellitus without complications: Secondary | ICD-10-CM | POA: Diagnosis not present

## 2019-01-31 DIAGNOSIS — I10 Essential (primary) hypertension: Secondary | ICD-10-CM | POA: Diagnosis not present

## 2019-01-31 DIAGNOSIS — Z7982 Long term (current) use of aspirin: Secondary | ICD-10-CM | POA: Diagnosis not present

## 2019-01-31 DIAGNOSIS — F419 Anxiety disorder, unspecified: Secondary | ICD-10-CM | POA: Diagnosis not present

## 2019-01-31 DIAGNOSIS — Z87891 Personal history of nicotine dependence: Secondary | ICD-10-CM | POA: Insufficient documentation

## 2019-01-31 DIAGNOSIS — F4321 Adjustment disorder with depressed mood: Secondary | ICD-10-CM

## 2019-01-31 LAB — BASIC METABOLIC PANEL
Anion gap: 11 (ref 5–15)
BUN: UNDETERMINED mg/dL (ref 8–23)
BUN: 9 mg/dL (ref 8–23)
CO2: UNDETERMINED mmol/L (ref 22–32)
CO2: 24 mmol/L (ref 22–32)
Calcium: UNDETERMINED mg/dL (ref 8.9–10.3)
Calcium: 9.8 mg/dL (ref 8.9–10.3)
Chloride: UNDETERMINED mmol/L (ref 98–111)
Chloride: 104 mmol/L (ref 98–111)
Creatinine, Ser: 0.7 mg/dL (ref 0.44–1.00)
Creatinine, Ser: 0.69 mg/dL (ref 0.44–1.00)
GFR calc Af Amer: >60 mL/min (ref >60)
GFR calc non Af Amer: >60 mL/min (ref >60)
Glucose, Bld: 142 mg/dL — ABNORMAL HIGH (ref 70–99)
Glucose, Bld: 107 mg/dL — ABNORMAL HIGH (ref 70–99)
Potassium: UNDETERMINED mmol/L (ref 3.5–5.1)
Potassium: 4 mmol/L (ref 3.5–5.1)
Sodium: UNDETERMINED mmol/L (ref 135–145)
Sodium: 139 mmol/L (ref 135–145)

## 2019-01-31 LAB — URINALYSIS, ROUTINE W REFLEX MICROSCOPIC
Bacteria, UA: NONE SEEN
Bilirubin Urine: NEGATIVE
Glucose, UA: NEGATIVE mg/dL
Ketones, ur: NEGATIVE mg/dL
Leukocytes,Ua: NEGATIVE
Nitrite: NEGATIVE
Protein, ur: NEGATIVE mg/dL
Specific Gravity, Urine: 1.008 (ref 1.005–1.030)
pH: 7 (ref 5.0–8.0)

## 2019-01-31 LAB — CBC
HCT: 45.8 % (ref 36.0–46.0)
Hemoglobin: 14.3 g/dL (ref 12.0–15.0)
MCH: 28.3 pg (ref 26.0–34.0)
MCHC: 31.2 g/dL (ref 30.0–36.0)
MCV: 90.5 fL (ref 80.0–100.0)
Platelets: 239 10*3/uL (ref 150–400)
RBC: 5.06 MIL/uL (ref 3.87–5.11)
RDW: 13.3 % (ref 11.5–15.5)
WBC: 4.4 10*3/uL (ref 4.0–10.5)
nRBC: 0 % (ref 0.0–0.2)

## 2019-01-31 LAB — TROPONIN I (HIGH SENSITIVITY)
Troponin I (High Sensitivity): 4 ng/L (ref <18)
Troponin I (High Sensitivity): 4 ng/L (ref <18)

## 2019-01-31 MED ORDER — IOHEXOL 350 MG/ML SOLN
75.0000 mL | Freq: Once | INTRAVENOUS | Status: AC | PRN
  Administered 2019-01-31: 19:00:00 75 mL via INTRAVENOUS

## 2019-01-31 MED ORDER — SODIUM CHLORIDE 0.9% FLUSH: 3.0000 mL | Freq: Once | INTRAVENOUS | Status: DC
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
## 2019-01-31 NOTE — ED Notes (Signed)
Patient verbalizes understanding of discharge instructions. Opportunity for questioning and answers were provided. Armband removed by staff, pt discharged from ED ambulatory by self\  

## 2019-01-31 NOTE — ED Triage Notes (Signed)
Pt reports weakness, pressure/heaviness to l chest radiating to L abdomen. Pt reports no energy. Pt reports recently losing her mother who was in a nursing home, feels like that has been weighing on her mind.

## 2019-02-01 LAB — SARS CORONAVIRUS 2 (TAT 6-24 HRS): SARS Coronavirus 2: NEGATIVE

## 2019-05-05 ENCOUNTER — Other Ambulatory Visit: Payer: Self-pay | Admitting: Surgery

## 2019-05-05 DIAGNOSIS — N6092 Unspecified benign mammary dysplasia of left breast: Secondary | ICD-10-CM

## 2019-08-06 ENCOUNTER — Other Ambulatory Visit: Payer: Self-pay | Admitting: Family Medicine

## 2019-08-06 ENCOUNTER — Ambulatory Visit
Admission: RE | Admit: 2019-08-06 | Discharge: 2019-08-06 | Disposition: A | Discharge status: Home / Self Care | Payer: Medicare Other | Source: Ambulatory Visit | Attending: Family Medicine | Admitting: Family Medicine

## 2019-08-06 DIAGNOSIS — M545 Low back pain, unspecified: Secondary | ICD-10-CM

## 2019-08-09 ENCOUNTER — Ambulatory Visit: Payer: Medicare Other

## 2019-09-04 ENCOUNTER — Other Ambulatory Visit: Payer: Self-pay | Admitting: Surgery

## 2019-09-04 DIAGNOSIS — N6092 Unspecified benign mammary dysplasia of left breast: Secondary | ICD-10-CM

## 2019-09-15 ENCOUNTER — Ambulatory Visit: Payer: Medicare Other | Admitting: Podiatry

## 2019-09-16 ENCOUNTER — Ambulatory Visit: Payer: Medicare Other | Admitting: Podiatry

## 2019-09-16 ENCOUNTER — Other Ambulatory Visit: Payer: Self-pay

## 2019-09-16 ENCOUNTER — Encounter: Payer: Self-pay | Admitting: Podiatry

## 2019-09-16 DIAGNOSIS — L603 Nail dystrophy: Secondary | ICD-10-CM | POA: Diagnosis not present

## 2019-09-16 NOTE — Progress Notes (Signed)
This patient presents the office with chief complaint of a blackened area underneath her big toenail left foot.  She is also concerned about the thickened fourth toenail left foot.  She says that she previously had bunion surgery as well as nail surgery for the elimination of the ingrowing toenails left great toe.  She states she is not having pain or discomfort in her left great toenail but is concerned about the discoloration.  Patient says she has not experienced any drainage from the nail left great toe.  She says the fourth toenail on the left foot is thick and painful walking and wearing her shoes.  She presents the office today for an evaluation and treatment of her left foot.  General Appearance  Alert, conversant and in no acute stress.  Vascular  Dorsalis pedis   pulses are  Weakly  palpable  bilaterally. Posterior tibial pulses are not palpable due to swelling. Capillary return is within normal limits  bilaterally. Temperature is within normal limits  bilaterally.  Neurologic  Senn-Weinstein monofilament wire test diminished   bilaterally. Muscle power within normal limits bilaterally.  Nails Thick disfigured discolored nails with subungual debris  Fourth toe left foot.  Thickened hallux nail noted with blood at the  base of the proximal nail fold left foot subungually..   No evidence of bacterial infection or drainage bilaterally.  Orthopedic  No limitations of motion  feet .  No crepitus or effusions noted.  No bony pathology or digital deformities noted.  Skin  normotropic skin with no porokeratosis noted bilaterally.  No signs of infections or ulcers noted.  Asymptomatic callus under 1st MPJ  B/L.   Dystropic nails left foot.  IE> discussed this condition with this patient.  Told her that the blood that is present under the proximal nail fold is due to excess motion of the remnant nail plate following her nail surgery.  Describes to this patient that she had a fungus and/or injury to  the fourth toenail of the left foot which has allowed this nail to grow thick.  RTC prn.   Gardiner Barefoot DPM

## 2019-09-21 ENCOUNTER — Ambulatory Visit: Payer: Medicare Other | Admitting: Podiatry

## 2019-11-15 ENCOUNTER — Encounter (HOSPITAL_COMMUNITY): Payer: Self-pay | Admitting: Emergency Medicine

## 2019-11-15 ENCOUNTER — Other Ambulatory Visit: Payer: Self-pay

## 2019-11-15 ENCOUNTER — Emergency Department (HOSPITAL_COMMUNITY)
Admission: EM | Admit: 2019-11-15 | Discharge: 2019-11-16 | Disposition: A | Discharge status: Home / Self Care | Payer: Medicare Other | Attending: Emergency Medicine | Admitting: Emergency Medicine

## 2019-11-15 ENCOUNTER — Emergency Department (HOSPITAL_COMMUNITY): Payer: Medicare Other

## 2019-11-15 DIAGNOSIS — X58XXXA Exposure to other specified factors, initial encounter: Secondary | ICD-10-CM | POA: Insufficient documentation

## 2019-11-15 DIAGNOSIS — Y929 Unspecified place or not applicable: Secondary | ICD-10-CM | POA: Insufficient documentation

## 2019-11-15 DIAGNOSIS — Z86711 Personal history of pulmonary embolism: Secondary | ICD-10-CM | POA: Insufficient documentation

## 2019-11-15 DIAGNOSIS — R0789 Other chest pain: Secondary | ICD-10-CM | POA: Diagnosis not present

## 2019-11-15 DIAGNOSIS — R1032 Left lower quadrant pain: Secondary | ICD-10-CM | POA: Diagnosis present

## 2019-11-15 DIAGNOSIS — Y939 Activity, unspecified: Secondary | ICD-10-CM | POA: Diagnosis not present

## 2019-11-15 DIAGNOSIS — I10 Essential (primary) hypertension: Secondary | ICD-10-CM | POA: Diagnosis not present

## 2019-11-15 DIAGNOSIS — Z7984 Long term (current) use of oral hypoglycemic drugs: Secondary | ICD-10-CM | POA: Insufficient documentation

## 2019-11-15 DIAGNOSIS — S76912A Strain of unspecified muscles, fascia and tendons at thigh level, left thigh, initial encounter: Secondary | ICD-10-CM | POA: Insufficient documentation

## 2019-11-15 DIAGNOSIS — E119 Type 2 diabetes mellitus without complications: Secondary | ICD-10-CM | POA: Diagnosis not present

## 2019-11-15 DIAGNOSIS — Y999 Unspecified external cause status: Secondary | ICD-10-CM | POA: Insufficient documentation

## 2019-11-15 LAB — COMPREHENSIVE METABOLIC PANEL
ALT: 26 U/L (ref 0–44)
AST: 42 U/L — ABNORMAL HIGH (ref 15–41)
Albumin: 3.7 g/dL (ref 3.5–5.0)
Alkaline Phosphatase: 86 U/L (ref 38–126)
Anion gap: 15 (ref 5–15)
BUN: 12 mg/dL (ref 8–23)
CO2: 19 mmol/L — ABNORMAL LOW (ref 22–32)
Calcium: 9.5 mg/dL (ref 8.9–10.3)
Chloride: 103 mmol/L (ref 98–111)
Creatinine, Ser: 0.65 mg/dL (ref 0.44–1.00)
GFR calc Af Amer: >60 mL/min (ref >60)
GFR calc non Af Amer: >60 mL/min (ref >60)
Glucose, Bld: 151 mg/dL — ABNORMAL HIGH (ref 70–99)
Potassium: 4.2 mmol/L (ref 3.5–5.1)
Sodium: 137 mmol/L (ref 135–145)
Total Bilirubin: 1.2 mg/dL (ref 0.3–1.2)
Total Protein: 6.9 g/dL (ref 6.5–8.1)

## 2019-11-15 LAB — CBC
HCT: 44 % (ref 36.0–46.0)
Hemoglobin: 13.8 g/dL (ref 12.0–15.0)
MCH: 28.6 pg (ref 26.0–34.0)
MCHC: 31.4 g/dL (ref 30.0–36.0)
MCV: 91.3 fL (ref 80.0–100.0)
Platelets: 278 10*3/uL (ref 150–400)
RBC: 4.82 MIL/uL (ref 3.87–5.11)
RDW: 13.2 % (ref 11.5–15.5)
WBC: 4.5 10*3/uL (ref 4.0–10.5)
nRBC: 0 % (ref 0.0–0.2)

## 2019-11-15 LAB — LIPASE, BLOOD: Lipase: 25 U/L (ref 11–51)

## 2019-11-15 MED ORDER — SODIUM CHLORIDE 0.9 % IV BOLUS
1000.0000 mL | Freq: Once | INTRAVENOUS | Status: AC
  Administered 2019-11-15: 1000 mL via INTRAVENOUS

## 2019-11-15 MED ORDER — SODIUM CHLORIDE 0.9% FLUSH
3.0000 mL | Freq: Once | INTRAVENOUS | Status: AC
  Administered 2019-11-15: 3 mL via INTRAVENOUS
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
## 2019-11-15 NOTE — ED Triage Notes (Signed)
C/o intermittent L thigh pain x 4 years that is gradually getting worse.  LLQ "pinching" pain x 3 days with epigastric pain.  Denies nausea, vomiting, and diarrhea.  Pt states she is under stress due to mom's death last 2023-01-24 and just having her first mother's day without her.

## 2019-11-15 NOTE — ED Notes (Signed)
Pt returned from Radiology.

## 2019-11-15 NOTE — ED Notes (Signed)
This RN was unable to draw blood during PIV insertion. Phlebotomy made aware.

## 2019-11-16 LAB — TROPONIN I (HIGH SENSITIVITY): Troponin I (High Sensitivity): 4 ng/L (ref <18)

## 2019-11-16 LAB — D-DIMER, QUANTITATIVE: D-Dimer, Quant: 0.66 ug/mL-FEU — ABNORMAL HIGH (ref 0.00–0.50)

## 2019-11-16 NOTE — ED Notes (Signed)
Patient verbalizes understanding of discharge instructions. Opportunity for questioning and answers were provided. Armband removed by staff, pt discharged from ED in wheelchair to home.   

## 2019-11-16 NOTE — ED Provider Notes (Signed)
I assumed care of this patient.  Please see previous provider note for further details of Hx, PE.  Briefly patient is a 69 y.o. female who presented abdominal discomfort with flushing to her chest.  Work-up has been negative with a negative CT.  Currently pending a D-dimer given her history of prior PEs.  D-dimer is below age-adjusted level.  Given the atypical nature of the symptoms, do not feel that PE is likely with this dimer level.  The patient appears reasonably screened and/or stabilized for discharge and I doubt any other medical condition or other [facility] requiring further screening, evaluation, or treatment in the ED at this time prior to discharge. Safe for discharge with strict return precautions.  Disposition: Discharge  Condition: Good  I have discussed the results, Dx and Tx plan with the patient/family who expressed understanding and agree(s) with the plan. Discharge instructions discussed at length. The patient/family was given strict return precautions who verbalized understanding of the instructions. No further questions at time of discharge.    ED Discharge Orders    None      Follow Up: Kathyrn Lass, MD [place]  [id number] [phone]  Schedule an appointment as soon as possible for a visit in 2 days As needed         Fatima Blank, MD 11/16/19 [phone]

## 2019-12-01 ENCOUNTER — Other Ambulatory Visit (HOSPITAL_COMMUNITY): Payer: Self-pay | Admitting: Student

## 2019-12-01 ENCOUNTER — Other Ambulatory Visit: Payer: Self-pay

## 2019-12-01 ENCOUNTER — Ambulatory Visit (HOSPITAL_COMMUNITY)
Admission: RE | Admit: 2019-12-01 | Discharge: 2019-12-01 | Disposition: A | Discharge status: Home / Self Care | Payer: Medicare Other | Source: Ambulatory Visit | Attending: Cardiovascular Disease | Admitting: Cardiovascular Disease

## 2019-12-01 DIAGNOSIS — R2242 Localized swelling, mass and lump, left lower limb: Secondary | ICD-10-CM | POA: Insufficient documentation

## 2019-12-09 ENCOUNTER — Encounter (HOSPITAL_COMMUNITY): Payer: Self-pay | Admitting: Emergency Medicine

## 2019-12-09 ENCOUNTER — Emergency Department (HOSPITAL_COMMUNITY)
Admission: EM | Admit: 2019-12-09 | Discharge: 2019-12-09 | Disposition: A | Discharge status: Home / Self Care | Payer: Medicare Other | Attending: Emergency Medicine | Admitting: Emergency Medicine

## 2019-12-09 DIAGNOSIS — Z7984 Long term (current) use of oral hypoglycemic drugs: Secondary | ICD-10-CM | POA: Diagnosis not present

## 2019-12-09 DIAGNOSIS — Z87891 Personal history of nicotine dependence: Secondary | ICD-10-CM | POA: Diagnosis not present

## 2019-12-09 DIAGNOSIS — M545 Low back pain, unspecified: Secondary | ICD-10-CM

## 2019-12-09 DIAGNOSIS — Z79899 Other long term (current) drug therapy: Secondary | ICD-10-CM | POA: Insufficient documentation

## 2019-12-09 DIAGNOSIS — Z7982 Long term (current) use of aspirin: Secondary | ICD-10-CM | POA: Insufficient documentation

## 2019-12-09 DIAGNOSIS — I10 Essential (primary) hypertension: Secondary | ICD-10-CM | POA: Diagnosis not present

## 2019-12-09 DIAGNOSIS — Z96652 Presence of left artificial knee joint: Secondary | ICD-10-CM | POA: Insufficient documentation

## 2019-12-09 DIAGNOSIS — G8929 Other chronic pain: Secondary | ICD-10-CM | POA: Insufficient documentation

## 2019-12-09 DIAGNOSIS — E119 Type 2 diabetes mellitus without complications: Secondary | ICD-10-CM | POA: Insufficient documentation

## 2019-12-09 MED ORDER — OXYCODONE-ACETAMINOPHEN 7.5-325 MG PO TABS
1.0000 | ORAL_TABLET | Freq: Once | ORAL | Status: AC
  Administered 2019-12-09: 1 via ORAL
  Filled 2019-12-09: qty 1

## 2019-12-09 NOTE — ED Notes (Signed)
Pt ambulated in the hall with a walker. Pt stated she felt better and was not in pain.

## 2019-12-09 NOTE — Discharge Instructions (Addendum)
Please make sure to follow-up with your neurology PA.  Continue to take your medication as prescribed.  Return to the ER if your symptoms worsen.

## 2019-12-09 NOTE — ED Triage Notes (Signed)
Pt. Stated, when I got up this morning I could hardly move cause my back was hurting so bad.
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
## 2020-06-29 ENCOUNTER — Emergency Department (HOSPITAL_COMMUNITY): Payer: Medicare Other

## 2020-06-29 ENCOUNTER — Other Ambulatory Visit: Payer: Self-pay

## 2020-06-29 ENCOUNTER — Encounter (HOSPITAL_COMMUNITY): Payer: Self-pay

## 2020-06-29 ENCOUNTER — Inpatient Hospital Stay (HOSPITAL_COMMUNITY)
Admission: EM | Admit: 2020-06-29 | Discharge: 2020-07-01 | DRG: 552 | Disposition: A | Discharge status: Home Health | Payer: Medicare Other | Attending: Family Medicine | Admitting: Family Medicine

## 2020-06-29 DIAGNOSIS — Z888 Allergy status to other drugs, medicaments and biological substances status: Secondary | ICD-10-CM | POA: Diagnosis not present

## 2020-06-29 DIAGNOSIS — F32A Depression, unspecified: Secondary | ICD-10-CM | POA: Diagnosis not present

## 2020-06-29 DIAGNOSIS — Z79899 Other long term (current) drug therapy: Secondary | ICD-10-CM | POA: Diagnosis not present

## 2020-06-29 DIAGNOSIS — M48061 Spinal stenosis, lumbar region without neurogenic claudication: Secondary | ICD-10-CM | POA: Diagnosis present

## 2020-06-29 DIAGNOSIS — M797 Fibromyalgia: Secondary | ICD-10-CM | POA: Diagnosis present

## 2020-06-29 DIAGNOSIS — G894 Chronic pain syndrome: Secondary | ICD-10-CM | POA: Diagnosis present

## 2020-06-29 DIAGNOSIS — M549 Dorsalgia, unspecified: Secondary | ICD-10-CM | POA: Diagnosis present

## 2020-06-29 DIAGNOSIS — I10 Essential (primary) hypertension: Secondary | ICD-10-CM | POA: Diagnosis not present

## 2020-06-29 DIAGNOSIS — Z8669 Personal history of other diseases of the nervous system and sense organs: Secondary | ICD-10-CM | POA: Diagnosis not present

## 2020-06-29 DIAGNOSIS — M545 Low back pain, unspecified: Secondary | ICD-10-CM | POA: Diagnosis not present

## 2020-06-29 DIAGNOSIS — D869 Sarcoidosis, unspecified: Secondary | ICD-10-CM | POA: Diagnosis present

## 2020-06-29 DIAGNOSIS — Z8673 Personal history of transient ischemic attack (TIA), and cerebral infarction without residual deficits: Secondary | ICD-10-CM

## 2020-06-29 DIAGNOSIS — M5127 Other intervertebral disc displacement, lumbosacral region: Principal | ICD-10-CM | POA: Diagnosis present

## 2020-06-29 DIAGNOSIS — Z7984 Long term (current) use of oral hypoglycemic drugs: Secondary | ICD-10-CM

## 2020-06-29 DIAGNOSIS — Z6841 Body Mass Index (BMI) 40.0 and over, adult: Secondary | ICD-10-CM | POA: Diagnosis not present

## 2020-06-29 DIAGNOSIS — Z91041 Radiographic dye allergy status: Secondary | ICD-10-CM

## 2020-06-29 DIAGNOSIS — E1169 Type 2 diabetes mellitus with other specified complication: Secondary | ICD-10-CM | POA: Diagnosis not present

## 2020-06-29 DIAGNOSIS — F419 Anxiety disorder, unspecified: Secondary | ICD-10-CM | POA: Diagnosis present

## 2020-06-29 DIAGNOSIS — Z86711 Personal history of pulmonary embolism: Secondary | ICD-10-CM

## 2020-06-29 DIAGNOSIS — D860 Sarcoidosis of lung: Secondary | ICD-10-CM | POA: Diagnosis not present

## 2020-06-29 DIAGNOSIS — E669 Obesity, unspecified: Secondary | ICD-10-CM | POA: Diagnosis not present

## 2020-06-29 DIAGNOSIS — M5126 Other intervertebral disc displacement, lumbar region: Secondary | ICD-10-CM | POA: Diagnosis present

## 2020-06-29 DIAGNOSIS — E119 Type 2 diabetes mellitus without complications: Secondary | ICD-10-CM | POA: Diagnosis not present

## 2020-06-29 DIAGNOSIS — E785 Hyperlipidemia, unspecified: Secondary | ICD-10-CM | POA: Diagnosis not present

## 2020-06-29 DIAGNOSIS — M4807 Spinal stenosis, lumbosacral region: Secondary | ICD-10-CM | POA: Diagnosis present

## 2020-06-29 DIAGNOSIS — Z20822 Contact with and (suspected) exposure to covid-19: Secondary | ICD-10-CM | POA: Diagnosis present

## 2020-06-29 DIAGNOSIS — Z7982 Long term (current) use of aspirin: Secondary | ICD-10-CM

## 2020-06-29 DIAGNOSIS — Z90710 Acquired absence of both cervix and uterus: Secondary | ICD-10-CM | POA: Diagnosis not present

## 2020-06-29 DIAGNOSIS — M4640 Discitis, unspecified, site unspecified: Secondary | ICD-10-CM

## 2020-06-29 DIAGNOSIS — Z87891 Personal history of nicotine dependence: Secondary | ICD-10-CM | POA: Diagnosis not present

## 2020-06-29 DIAGNOSIS — Z881 Allergy status to other antibiotic agents status: Secondary | ICD-10-CM | POA: Diagnosis not present

## 2020-06-29 DIAGNOSIS — M546 Pain in thoracic spine: Secondary | ICD-10-CM | POA: Diagnosis not present

## 2020-06-29 DIAGNOSIS — M47816 Spondylosis without myelopathy or radiculopathy, lumbar region: Secondary | ICD-10-CM | POA: Diagnosis not present

## 2020-06-29 DIAGNOSIS — Z96652 Presence of left artificial knee joint: Secondary | ICD-10-CM | POA: Diagnosis not present

## 2020-06-29 LAB — CBC WITH DIFFERENTIAL/PLATELET
Abs Immature Granulocytes: 0.02 10*3/uL (ref 0.00–0.07)
Basophils Absolute: 0 10*3/uL (ref 0.0–0.1)
Basophils Relative: 1 %
Eosinophils Absolute: 0.1 10*3/uL (ref 0.0–0.5)
Eosinophils Relative: 1 %
HCT: 42.5 % (ref 36.0–46.0)
Hemoglobin: 14 g/dL (ref 12.0–15.0)
Immature Granulocytes: 0 %
Lymphocytes Relative: 20 %
Lymphs Abs: 1.2 10*3/uL (ref 0.7–4.0)
MCH: 29.1 pg (ref 26.0–34.0)
MCHC: 32.9 g/dL (ref 30.0–36.0)
MCV: 88.4 fL (ref 80.0–100.0)
Monocytes Absolute: 0.7 10*3/uL (ref 0.1–1.0)
Monocytes Relative: 12 %
Neutro Abs: 4.1 10*3/uL (ref 1.7–7.7)
Neutrophils Relative %: 66 %
Platelets: 244 10*3/uL (ref 150–400)
RBC: 4.81 MIL/uL (ref 3.87–5.11)
RDW: 13.3 % (ref 11.5–15.5)
WBC: 6.2 10*3/uL (ref 4.0–10.5)
nRBC: 0 % (ref 0.0–0.2)

## 2020-06-29 LAB — BASIC METABOLIC PANEL
Anion gap: 12 (ref 5–15)
BUN: 9 mg/dL (ref 8–23)
CO2: 24 mmol/L (ref 22–32)
Calcium: 9.7 mg/dL (ref 8.9–10.3)
Chloride: 98 mmol/L (ref 98–111)
Creatinine, Ser: 0.62 mg/dL (ref 0.44–1.00)
GFR, Estimated: >60 mL/min (ref >60)
Glucose, Bld: 95 mg/dL (ref 70–99)
Potassium: 3.7 mmol/L (ref 3.5–5.1)
Sodium: 134 mmol/L — ABNORMAL LOW (ref 135–145)

## 2020-06-29 LAB — URINALYSIS, ROUTINE W REFLEX MICROSCOPIC
Bilirubin Urine: NEGATIVE
Glucose, UA: NEGATIVE mg/dL
Hgb urine dipstick: NEGATIVE
Ketones, ur: NEGATIVE mg/dL
Leukocytes,Ua: NEGATIVE
Nitrite: NEGATIVE
Protein, ur: NEGATIVE mg/dL
Specific Gravity, Urine: 1.008 (ref 1.005–1.030)
pH: 6 (ref 5.0–8.0)

## 2020-06-29 MED ORDER — ACETAMINOPHEN 325 MG PO TABS: 650.0000 mg | ORAL_TABLET | Freq: Once | ORAL | Status: DC

## 2020-06-29 MED ORDER — NAPROXEN 500 MG PO TABS
500.0000 mg | ORAL_TABLET | Freq: Two times a day (BID) | ORAL | 0 refills | Status: DC
Start: 2020-06-29 — End: 2020-07-25

## 2020-06-29 MED ORDER — CYCLOBENZAPRINE HCL 10 MG PO TABS
5.0000 mg | ORAL_TABLET | Freq: Three times a day (TID) | ORAL | Status: DC | PRN
  Administered 2020-06-30: 10 mg via ORAL
  Filled 2020-06-29: qty 1

## 2020-06-29 MED ORDER — LORAZEPAM 2 MG/ML IJ SOLN
1.0000 mg | Freq: Once | INTRAMUSCULAR | Status: AC
  Administered 2020-06-29: 1 mg via INTRAVENOUS
  Filled 2020-06-29: qty 1

## 2020-06-29 MED ORDER — INSULIN ASPART 100 UNIT/ML ~~LOC~~ SOLN
0.0000 [IU] | Freq: Three times a day (TID) | SUBCUTANEOUS | Status: DC
  Administered 2020-06-30 – 2020-07-01 (×3): 1 [IU] via SUBCUTANEOUS

## 2020-06-29 MED ORDER — KETOROLAC TROMETHAMINE 30 MG/ML IJ SOLN
30.0000 mg | Freq: Once | INTRAMUSCULAR | Status: AC
  Administered 2020-06-29: 30 mg via INTRAMUSCULAR
  Filled 2020-06-29: qty 1

## 2020-06-29 MED ORDER — METHOCARBAMOL 500 MG PO TABS
500.0000 mg | ORAL_TABLET | Freq: Once | ORAL | Status: AC
  Administered 2020-06-29: 500 mg via ORAL
  Filled 2020-06-29: qty 1

## 2020-06-29 MED ORDER — VANCOMYCIN HCL 2000 MG/400ML IV SOLN
2000.0000 mg | Freq: Once | INTRAVENOUS | Status: DC
  Filled 2020-06-29: qty 400

## 2020-06-29 MED ORDER — ACETAMINOPHEN 650 MG RE SUPP: 650.0000 mg | Freq: Four times a day (QID) | RECTAL | Status: DC | PRN

## 2020-06-29 MED ORDER — VANCOMYCIN HCL 750 MG/150ML IV SOLN: 750.0000 mg | Freq: Two times a day (BID) | INTRAVENOUS | Status: DC

## 2020-06-29 MED ORDER — OMEGA-3-ACID ETHYL ESTERS 1 G PO CAPS
1.0000 g | ORAL_CAPSULE | Freq: Every day | ORAL | Status: DC
  Administered 2020-06-30 – 2020-07-01 (×2): 1 g via ORAL
  Filled 2020-06-29 (×2): qty 1

## 2020-06-29 MED ORDER — LIDOCAINE 5 % EX PTCH
2.0000 | MEDICATED_PATCH | CUTANEOUS | Status: DC
  Administered 2020-06-29 – 2020-07-01 (×3): 2 via TRANSDERMAL
  Filled 2020-06-29 (×4): qty 2

## 2020-06-29 MED ORDER — ACETAMINOPHEN 325 MG PO TABS: 650.0000 mg | ORAL_TABLET | Freq: Four times a day (QID) | ORAL | Status: DC | PRN

## 2020-06-29 MED ORDER — GADOBUTROL 1 MMOL/ML IV SOLN
10.0000 mL | Freq: Once | INTRAVENOUS | Status: AC | PRN
  Administered 2020-06-29: 10 mL via INTRAVENOUS

## 2020-06-29 MED ORDER — VITAMIN D 25 MCG (1000 UNIT) PO TABS
1000.0000 [IU] | ORAL_TABLET | Freq: Every day | ORAL | Status: DC
  Administered 2020-06-30 – 2020-07-01 (×2): 1000 [IU] via ORAL
  Filled 2020-06-29 (×2): qty 1

## 2020-06-29 MED ORDER — OXYCODONE-ACETAMINOPHEN 7.5-325 MG PO TABS
1.0000 | ORAL_TABLET | Freq: Four times a day (QID) | ORAL | Status: DC | PRN
  Administered 2020-06-30 – 2020-07-01 (×5): 1 via ORAL
  Filled 2020-06-29 (×5): qty 1

## 2020-06-29 MED ORDER — METHOCARBAMOL 500 MG PO TABS
500.0000 mg | ORAL_TABLET | Freq: Two times a day (BID) | ORAL | 0 refills | Status: DC
Start: 2020-06-29 — End: 2020-07-25

## 2020-06-29 MED ORDER — GADOBUTROL 1 MMOL/ML IV SOLN: 10.0000 mL | Freq: Once | INTRAVENOUS | Status: DC | PRN

## 2020-06-29 MED ORDER — VITAMIN B-6 50 MG PO TABS
ORAL_TABLET | Freq: Every day | ORAL | Status: DC
  Administered 2020-06-30 – 2020-07-01 (×2): 50 mg via ORAL
  Filled 2020-06-29 (×2): qty 1

## 2020-06-29 MED ORDER — LISINOPRIL 5 MG PO TABS
2.5000 mg | ORAL_TABLET | Freq: Every day | ORAL | Status: DC
Start: 2020-06-29 — End: 2020-07-01
  Administered 2020-06-30: 2.5 mg via ORAL
  Filled 2020-06-29 (×2): qty 1

## 2020-06-29 MED ORDER — SODIUM CHLORIDE 0.9 % IV SOLN: 2.0000 g | Freq: Three times a day (TID) | INTRAVENOUS | Status: DC

## 2020-06-29 MED ORDER — ROSUVASTATIN CALCIUM 20 MG PO TABS
20.0000 mg | ORAL_TABLET | Freq: Every day | ORAL | Status: DC
  Administered 2020-06-30: 20 mg via ORAL
  Filled 2020-06-29: qty 1
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
## 2020-06-30 DIAGNOSIS — I10 Essential (primary) hypertension: Secondary | ICD-10-CM

## 2020-06-30 DIAGNOSIS — D860 Sarcoidosis of lung: Secondary | ICD-10-CM | POA: Diagnosis not present

## 2020-06-30 DIAGNOSIS — M4640 Discitis, unspecified, site unspecified: Secondary | ICD-10-CM

## 2020-06-30 DIAGNOSIS — E119 Type 2 diabetes mellitus without complications: Secondary | ICD-10-CM | POA: Diagnosis not present

## 2020-06-30 DIAGNOSIS — M545 Low back pain, unspecified: Secondary | ICD-10-CM | POA: Diagnosis not present

## 2020-06-30 LAB — GLUCOSE, CAPILLARY
Glucose-Capillary: 121 mg/dL — ABNORMAL HIGH (ref 70–99)
Glucose-Capillary: 117 mg/dL — ABNORMAL HIGH (ref 70–99)
Glucose-Capillary: 126 mg/dL — ABNORMAL HIGH (ref 70–99)
Glucose-Capillary: 134 mg/dL — ABNORMAL HIGH (ref 70–99)

## 2020-06-30 LAB — HIV ANTIBODY (ROUTINE TESTING W REFLEX): HIV Screen 4th Generation wRfx: NONREACTIVE

## 2020-06-30 LAB — SARS CORONAVIRUS 2 (TAT 6-24 HRS): SARS Coronavirus 2: NEGATIVE

## 2020-06-30 LAB — SEDIMENTATION RATE: Sed Rate: 40 mm/hr — ABNORMAL HIGH (ref 0–22)

## 2020-06-30 LAB — C-REACTIVE PROTEIN: CRP: 10.8 mg/dL — ABNORMAL HIGH (ref <1.0)

## 2020-06-30 NOTE — Evaluation (Signed)
Physical Therapy Evaluation Patient Details Name: Samantha [name] MRN: [id number] DOB: 01/17/1951 Today's Date: 06/30/2020   History of Present Illness  Samantha [name] is a 70 y.o. female with history of diabetes mellitus type 2, hypertension, hyperlipidemia and morbid obesity and sarcoidosis presents to the ER because of worsening back pain ongoing for the last 1 week.  Clinical Impression  Pt able to participate in session. Pt PLOF was I with use of cane. Pt lives alone in an apt and has neighbors she can call for assist. Pt was limited by pain during activities and required increased assist to transition to standing from EOB. Pt requiring increased assist with use of cane to maintain balance. Education on use of rw for safety at this time. Pt demonstrating deficits in balance, strength, coordination, pain and activity tolerance and will benefit from skilled PT to address deficits to maximize independence with functional mobility prior to discharge.     Follow Up Recommendations Outpatient PT;Supervision - Intermittent    Equipment Recommendations  Rolling walker with 5" wheels    Recommendations for Other Services       Precautions / Restrictions Restrictions Weight Bearing Restrictions: No      Mobility  Bed Mobility Overal bed mobility: Modified Independent             General bed mobility comments: supine>sit with HOB elevated no assist needed    Transfers Overall transfer level: Needs assistance   Transfers: Sit to/from Stand Sit to Stand: Mod assist            Ambulation/Gait Ambulation/Gait assistance: Min assist   Assistive device: 1 person hand held assist;Quad cane   Gait velocity: decreased   General Gait Details: increased lateral weight shift with decreased step length and height B  Stairs            Wheelchair Mobility    Modified Rankin (Stroke Patients Only)       Balance Overall balance assessment: Needs  assistance Sitting-balance support: Feet supported Sitting balance-Leahy Scale: Fair     Standing balance support: Single extremity supported Standing balance-Leahy Scale: Poor                               Pertinent Vitals/Pain Pain Assessment: 0-10 Pain Score: 6  Pain Location: back    Home Living Family/patient expects to be discharged to:: Private residence Living Arrangements: Alone; neighbors can assist as needed Available Help at Discharge: Friend(s);Available PRN/intermittently Type of Home: Apartment       Home Layout: One level Home Equipment: Cane - quad;Cane - single point;Tub bench      Prior Function Level of Independence: Independent with assistive device(s)               Hand Dominance        Extremity/Trunk Assessment   Upper Extremity Assessment Upper Extremity Assessment: Overall WFL for tasks assessed    Lower Extremity Assessment Lower Extremity Assessment: RLE deficits/detail RLE Sensation: decreased light touch (R upper thigh numbness)       Communication   Communication: No difficulties  Cognition Arousal/Alertness: Awake/alert Behavior During Therapy: WFL for tasks assessed/performed Overall Cognitive Status: Within Functional Limits for tasks assessed                                        General Comments  Exercises     Assessment/Plan    PT Assessment Patient needs continued PT services  PT Problem List Decreased strength;Decreased mobility;Decreased coordination;Decreased activity tolerance;Decreased balance;Pain       PT Treatment Interventions DME instruction;Therapeutic exercise;Gait training;Balance training;Therapeutic activities;Patient/family education    PT Goals (Current goals can be found in the Care Plan section)  Acute Rehab PT Goals Patient Stated Goal: I want to have less pain PT Goal Formulation: With patient Time For Goal Achievement: 07/14/20 Potential to  Achieve Goals: Good    Frequency Min 3X/week   Barriers to discharge        Co-evaluation               AM-PAC PT "6 Clicks" Mobility  Outcome Measure Help needed turning from your back to your side while in a flat bed without using bedrails?: None Help needed moving from lying on your back to sitting on the side of a flat bed without using bedrails?: None Help needed moving to and from a bed to a chair (including a wheelchair)?: A Little Help needed standing up from a chair using your arms (e.g., wheelchair or bedside chair)?: A Lot Help needed to walk in hospital room?: A Little Help needed climbing 3-5 steps with a railing? : A Lot 6 Click Score: 18    End of Session Equipment Utilized During Treatment: Gait belt   Patient left: in chair;with chair alarm set;with call bell/phone within reach Nurse Communication: Mobility status PT Visit Diagnosis: Pain;Unsteadiness on feet (R26.81);Muscle weakness (generalized) (M62.81);Other abnormalities of gait and mobility (R26.89) Pain - part of body:  (back)    Time: [id number] PT Time Calculation (min) (ACUTE ONLY): 15 min   Charges:   PT Evaluation $PT Eval Low Complexity: [place], DPT Acute Rehabilitation Services PT:[id number]  [place] 06/30/2020, 2:41 PM

## 2020-06-30 NOTE — Plan of Care (Signed)
  Problem: Health Behavior/Discharge Planning: Goal: Ability to manage health-related needs will improve Outcome: Progressing   Problem: Clinical Measurements: Goal: Ability to maintain clinical measurements within normal limits will improve Outcome: Progressing Goal: Will remain free from infection Outcome: Progressing   Problem: Activity: Goal: Risk for activity intolerance will decrease Outcome: Progressing   

## 2020-06-30 NOTE — Progress Notes (Signed)
Interventional Radiology Brief Note:  Samantha [name] is a 70 year old female with history of back pain, recently diagnosed with a back sprain at Urgent Care who presented to [facility] ED overnight when her pain did not improve with pain medication.   MR Lumbar Thoracic Spine showed: 1. Motion degraded study. 2. T2 hyperintensity in the L3-4 endplate cyst, thought to be degenerative. However, given small increased T2 signal within the corresponding intervertebral disc, the possibility of discitis/osteomyelitis cannot be entirely excluded although it is felt to be less likely. No epidural abscess identified. 3. Multilevel degenerative changes of the lumbar spine with severe spinal canal stenosis at L3-4 and mild spinal canal stenosis at L2-3. 4. Severe right neural foraminal narrowing at L2-3. Moderate right neural foraminal stenosis at L4-5 and L5-S1. 5. Postsurgical changes from L4-5 and L5-S1 laminectomies.  IR consulted for possible disc aspiration per the recommendation of Neurosurgery. Imaging reviewed by Dr. Corliss Skains. There is no discrete fluid collection, and changes in attenuation/signalling can be attributed to several sources. In addition, there are several reasons she may have back pain including her DDD, her spinal canal stenosis, and bulging disc at several levels.  Disc aspiration likely to be of low yield in patient without strong imaging evidence of discitis/osteomyelitis.  No procedure for now per Dr. Corliss Skains. He has reached out to Dr. Toniann Fail to discuss. Could also consider ID consult.   Will lift NPO order for now.   Loyce Dys, MS RD PA-C 9:40 AM

## 2020-06-30 NOTE — Progress Notes (Addendum)
PROGRESS NOTE    Shauniece [name]  DSK:[id number] DOB: 31-Jul-1950 DOA: 06/29/2020 PCP: Kathyrn Lass, MD   Brief Narrative:   HPI: Samantha [name] is a 70 y.o. female with history of diabetes mellitus type 2, hypertension, hyperlipidemia and morbid obesity and sarcoidosis presents to the ER because of worsening back pain ongoing for the last 1 week.  Patient states the pain has been worsening making it difficult to walk.  Denies any incontinence of urine or bowel.  Patient is still able to walk with the help of a cane.  ED Course: In the ER patient was febrile with temperature 100.8 F.  MRI of the T-spine and L-spine were done there was some changes at T2 showing hyperintensity concerning for possible discitis versus osteomyelitis.  ER physician discussed with on-call neurosurgeon Dr. Christella Noa who advised getting interventional radiology for biopsy and blood cultures following which and wished to consider antibiotics.  Labs are largely unremarkable.  Covid testing negative.  Assessment & Plan:   Principal Problem:   Back pain Active Problems:   Sarcoidosis   HTN (hypertension)   Morbid obesity with BMI of 40.0-44.9, adult (HCC)   Diabetes mellitus type 2 in obese ([facility])  1. Lower back pain: Initial read of the MRI showed T-spine showing hyperintensity at T2 area for which we have to rule out osteomyelitis/discitis.  ER physician discussed with on-call neurosurgeon Dr. Christella Noa who advised getting interventional neurology for biopsy/aspiration and cultures.  IR was consulted however they opined that there is no strong evidence of discitis or osteomyelitis and thus deferred the procedure.  Patient complains of some numbness in the right upper lateral thigh but no weakness in the lower extremities or any problem with bowel or bladder control.  I called radiology and spoke to Dr. Macy Mis who was gracious enough to read MRI once again and provided me a second opinion and according to him, this  is more like a degenerative disc disease and he also believes that there is no strong evidence of osteomyelitis.  I have also paged out Dr. Estanislado Pandy and have been waiting for his call back.  In the interim I have also discussed with Dr. Christella Noa of neurosurgery who has deferred to radiology.  Patient has elevated CRP and ESR however no fever and no leukocytosis.  From chemistry perspective, she also does not have a strong evidence of osteomyelitis.  I will change in activity to as tolerated and consult PT OT.  If we see any strong evidence of osteomyelitis, I will consult ID. 2. Essential hypertension: Controlled.  Continue lisinopril. 3. Type 2 diabetes mellitus: Continue SSI. 4. Hyperlipidemia: Continue statin.  Addendum: Received a phone call from Dr. Estanislado Pandy who once again does not believe that the biopsy will be high yield however he was open to try if ID were to recommend the same.  Had to consult ID and I discussed with Dr. Juleen China.  Reviewed the chart again myself as well and it appears that patient had a low-grade fever of 100.6 yesterday as well.  ID is going to see officially.  Dr. Juleen China has been provided with Dr. Estanislado Pandy cell phone number to call them directly if he feels biopsies needed   DVT prophylaxis: SCDs Start: 06/29/20 2302   Code Status: Full Code  Family Communication:  None present at bedside.  Plan of care discussed with patient in length and he verbalized understanding and agreed with it.  Status is: Inpatient  Remains inpatient appropriate because:Inpatient level of care appropriate  due to severity of illness   Dispo: The patient is from: Home              Anticipated d/c is to: Home              Anticipated d/c date is: 1 day              Patient currently is not medically stable to d/c.        Estimated body mass index is 45.49 kg/m as calculated from the following:   Height as of this encounter: _0  (1.626 m).   Weight as of this encounter: 120.2  kg.      Nutritional status:               Consultants:   Neurosurgery over the phone  IR  Procedures:   None  Antimicrobials:  Anti-infectives (From admission, onward)   Start     Dose/Rate Route Frequency Ordered Stop   06/30/20 1100  vancomycin (VANCOREADY) IVPB 750 mg/150 mL  Status:  Discontinued        750 mg 150 mL/hr over 60 Minutes Intravenous Every 12 hours 06/29/20 2222 06/29/20 2223   06/29/20 2230  vancomycin (VANCOREADY) IVPB 2000 mg/400 mL  Status:  Discontinued        2,000 mg 200 mL/hr over 120 Minutes Intravenous  Once 06/29/20 2222 06/29/20 2223   06/29/20 2230  ceFEPIme (MAXIPIME) 2 g in sodium chloride 0.9 % 100 mL IVPB  Status:  Discontinued        2 g 200 mL/hr over 30 Minutes Intravenous Every 8 hours 06/29/20 2222 06/29/20 2223         Subjective: Patient seen and examined.  Still complains of low back pain but slightly improved than yesterday.  Also complains of some numbness in the right upper lateral thigh.  No other complaint.  Objective: Vitals:   06/30/20 0123 06/30/20 0411 06/30/20 0435 06/30/20 0804  BP: (!) 117/52 105/73 110/65 (!) 111/57  Pulse: (!) 105 (!) 103 (!) 109 98  Resp: _1 Temp: 98.1 F (36.7 C) 98 F (36.7 C) 98.3 F (36.8 C) 98.1 F (36.7 C)  TempSrc: Axillary Oral Oral Oral  SpO2: 94% 95% 94% 94%  Weight:      Height:        Intake/Output Summary (Last 24 hours) at 06/30/2020 1335 Last data filed at 06/30/2020 1100 Gross per 24 hour  Intake 240 ml  Output --  Net 240 ml   Filed Weights   06/29/20 0957  Weight: 120.2 kg    Examination:  General exam: Appears calm and comfortable, morbidly obese Respiratory system: Clear to auscultation. Respiratory effort normal. Cardiovascular system: S1 & S2 heard, RRR. No JVD, murmurs, rubs, gallops or clicks. No pedal edema. Gastrointestinal system: Abdomen is nondistended, soft and nontender. No organomegaly or masses felt. Normal bowel sounds  heard. Central nervous system: Alert and oriented. No focal neurological deficits. Extremities: Symmetric 5 x 5 power. Skin: No rashes, lesions or ulcers Psychiatry: Judgement and insight appear normal. Mood & affect appropriate.    Data Reviewed: I have personally reviewed following labs and imaging studies  CBC: Recent Labs  Lab 06/29/20 1626  WBC 6.2  NEUTROABS 4.1  HGB 14.0  HCT 42.5  MCV 88.4  PLT 144   Basic Metabolic Panel: Recent Labs  Lab 06/29/20 1626  NA 134*  K 3.7  CL 98  CO2 24  GLUCOSE 95  BUN  9  CREATININE 0.62  CALCIUM 9.7   GFR: Estimated Creatinine Clearance: 86 mL/min (by C-G formula based on SCr of 0.62 mg/dL). Liver Function Tests: No results for input(s): AST, ALT, ALKPHOS, BILITOT, PROT, ALBUMIN in the last 168 hours. No results for input(s): LIPASE, AMYLASE in the last 168 hours. No results for input(s): AMMONIA in the last 168 hours. Coagulation Profile: No results for input(s): INR, PROTIME in the last 168 hours. Cardiac Enzymes: No results for input(s): CKTOTAL, CKMB, CKMBINDEX, TROPONINI in the last 168 hours. BNP (last 3 results) No results for input(s): PROBNP in the last 8760 hours. HbA1C: No results for input(s): HGBA1C in the last 72 hours. CBG: Recent Labs  Lab 06/30/20 0801 06/30/20 1217  GLUCAP 121* 117*   Lipid Profile: No results for input(s): CHOL, HDL, LDLCALC, TRIG, CHOLHDL, LDLDIRECT in the last 72 hours. Thyroid Function Tests: No results for input(s): TSH, T4TOTAL, FREET4, T3FREE, THYROIDAB in the last 72 hours. Anemia Panel: No results for input(s): VITAMINB12, FOLATE, FERRITIN, TIBC, IRON, RETICCTPCT in the last 72 hours. Sepsis Labs: No results for input(s): PROCALCITON, LATICACIDVEN in the last 168 hours.  Recent Results (from the past 240 hour(s))  SARS CORONAVIRUS 2 (TAT 6-24 HRS) Nasopharyngeal Nasopharyngeal Swab     Status: None   Collection Time: 06/29/20  4:25 PM   Specimen: Nasopharyngeal Swab   Result Value Ref Range Status   SARS Coronavirus 2 NEGATIVE NEGATIVE Final    Comment: (NOTE) SARS-CoV-2 target nucleic acids are NOT DETECTED.  The SARS-CoV-2 RNA is generally detectable in upper and lower respiratory specimens during the acute phase of infection. Negative results do not preclude SARS-CoV-2 infection, do not rule out co-infections with other pathogens, and should not be used as the sole basis for treatment or other patient management decisions. Negative results must be combined with clinical observations, patient history, and epidemiological information. The expected result is Negative.  Fact Sheet for Patients: SugarRoll.be  Fact Sheet for Healthcare Providers: https://www.woods-mathews.com/  This test is not yet approved or cleared by the [place] FDA and  has been authorized for detection and/or diagnosis of SARS-CoV-2 by FDA under an Emergency Use Authorization (EUA). This EUA will remain  in effect (meaning this test can be used) for the duration of the COVID-19 declaration under Se ction 564(b)(1) of the Act, 21 U.S.C. section 360bbb-3(b)(1), unless the authorization is terminated or revoked sooner.  Performed at [facility], [place] [address], [place], [place] [address]   Blood culture (routine x 2)     Status: None (Preliminary result)   Collection Time: 06/29/20 11:35 PM   Specimen: BLOOD LEFT HAND  Result Value Ref Range Status   Specimen Description BLOOD LEFT HAND  Final   Special Requests   Final    BOTTLES DRAWN AEROBIC ONLY Blood Culture results may not be optimal due to an inadequate volume of blood received in culture bottles   Culture   Final    NO GROWTH < 12 HOURS Performed at [facility], [place] [address], [place], [place] [address]    Report Status PENDING  Incomplete  Blood culture (routine x 2)     Status: None (Preliminary result)   Collection Time: 06/30/20 12:03 AM    Specimen: BLOOD  Result Value Ref Range Status   Specimen Description BLOOD RIGHT ANTECUBITAL  Final   Special Requests   Final    BOTTLES DRAWN AEROBIC ONLY Blood Culture results may not be optimal due to  an inadequate volume of blood received in culture bottles   Culture   Final    NO GROWTH < 12 HOURS Performed at [facility], [address] [place], [place] [address]    Report Status PENDING  Incomplete      Radiology Studies: MR THORACIC SPINE W WO CONTRAST  Result Date: 06/29/2020 CLINICAL DATA:  Epidural abscess. Low back pain, infection suspected. EXAM: MRI THORACIC AND LUMBAR SPINE WITHOUT AND WITH CONTRAST TECHNIQUE: Multiplanar and multiecho pulse sequences of the thoracic and lumbar spine were obtained without and with intravenous contrast. CONTRAST:  54mL GADAVIST GADOBUTROL 1 MMOL/ML IV SOLN COMPARISON:  MRI of the lumbar spine November 24, 2015. FINDINGS: The study is degraded by motion. MRI THORACIC SPINE FINDINGS Alignment:  Normal anteroposterior alignment. Vertebrae: No fracture, evidence of discitis, or bone lesion. Cord:  Normal signal and morphology. Paraspinal and other soft tissues: Negative. Disc levels: Posterior disc protrusions at T3-4, T4-5, T5-6, T6-7, T7-8, T8-9, T9-10, T10 10 11 and T11-12 causing indentation on the thecal sac without high-grade spinal canal or neural foraminal stenosis. No epidural abscess identified. MRI LUMBAR SPINE FINDINGS Segmentation:  Standard. Alignment:  Small anterolisthesis of L3 over L4. Vertebrae: T2 hyperintensity in the L3-4 endplate cyst thought to degenerative. However, given small increased T2 signal within the corresponding intervertebral disc, the possibility of discitis/osteomyelitis cannot be entirely excluded. No epidural abscess identified. No acute fracture or aggressive bone lesion. Postsurgical changes from L4-5 L5-S1 laminectomy. Conus medullaris: Extends to the L1 level and appears normal. Paraspinal and other soft  tissues: Negative. Disc levels: T12-L1: No spinal canal or neural foraminal stenosis. L1-2: No spinal canal or neural foraminal stenosis. L2-3: Disc bulge, hypertrophic facet degenerative changes with bilateral joint effusion and ligamentum flavum redundancy resulting in mild spinal canal stenosis, severe right and mild left neural foraminal narrowing. L3-4: Disc bulge, hypertrophic facet degenerative changes resulting in severe spinal canal stenosis, moderate right and severe left neural foraminal stenosis. Findings are progressed from prior MRI. L4-5: Disc bulge with superimposed central disc protrusion and facet degenerative changes resulting in narrowing of the bilateral subarticular zones, moderate right and mild left neural foraminal narrowing. L5-S1: Disc bulge and facet degenerative changes resulting in narrowing of the bilateral subarticular zones and moderate bilateral neural foraminal narrowing. Empty sac appearance of the thecal sac at this level may be related to postsurgical arachnoiditis. IMPRESSION: 1. Motion degraded study. 2. T2 hyperintensity in the L3-4 endplate cyst, thought to be degenerative. However, given small increased T2 signal within the corresponding intervertebral disc, the possibility of discitis/osteomyelitis cannot be entirely excluded although it is felt to be less likely. No epidural abscess identified. 3. Multilevel degenerative changes of the lumbar spine with severe spinal canal stenosis at L3-4 and mild spinal canal stenosis at L2-3. 4. Severe right neural foraminal narrowing at L2-3. Moderate right neural foraminal stenosis at L4-5 and L5-S1. 5. Postsurgical changes from L4-5 and L5-S1 laminectomies. Electronically Signed   By: Baldemar Lenis M.D.   On: 06/29/2020 21:31   MR Lumbar Spine W Wo Contrast  Result Date: 06/29/2020 CLINICAL DATA:  Epidural abscess. Low back pain, infection suspected. EXAM: MRI THORACIC AND LUMBAR SPINE WITHOUT AND WITH CONTRAST  TECHNIQUE: Multiplanar and multiecho pulse sequences of the thoracic and lumbar spine were obtained without and with intravenous contrast. CONTRAST:  56mL GADAVIST GADOBUTROL 1 MMOL/ML IV SOLN COMPARISON:  MRI of the lumbar spine November 24, 2015. FINDINGS: The study is degraded by motion. MRI  THORACIC SPINE FINDINGS Alignment:  Normal anteroposterior alignment. Vertebrae: No fracture, evidence of discitis, or bone lesion. Cord:  Normal signal and morphology. Paraspinal and other soft tissues: Negative. Disc levels: Posterior disc protrusions at T3-4, T4-5, T5-6, T6-7, T7-8, T8-9, T9-10, T10 10 11 and T11-12 causing indentation on the thecal sac without high-grade spinal canal or neural foraminal stenosis. No epidural abscess identified. MRI LUMBAR SPINE FINDINGS Segmentation:  Standard. Alignment:  Small anterolisthesis of L3 over L4. Vertebrae: T2 hyperintensity in the L3-4 endplate cyst thought to degenerative. However, given small increased T2 signal within the corresponding intervertebral disc, the possibility of discitis/osteomyelitis cannot be entirely excluded. No epidural abscess identified. No acute fracture or aggressive bone lesion. Postsurgical changes from L4-5 L5-S1 laminectomy. Conus medullaris: Extends to the L1 level and appears normal. Paraspinal and other soft tissues: Negative. Disc levels: T12-L1: No spinal canal or neural foraminal stenosis. L1-2: No spinal canal or neural foraminal stenosis. L2-3: Disc bulge, hypertrophic facet degenerative changes with bilateral joint effusion and ligamentum flavum redundancy resulting in mild spinal canal stenosis, severe right and mild left neural foraminal narrowing. L3-4: Disc bulge, hypertrophic facet degenerative changes resulting in severe spinal canal stenosis, moderate right and severe left neural foraminal stenosis. Findings are progressed from prior MRI. L4-5: Disc bulge with superimposed central disc protrusion and facet degenerative changes  resulting in narrowing of the bilateral subarticular zones, moderate right and mild left neural foraminal narrowing. L5-S1: Disc bulge and facet degenerative changes resulting in narrowing of the bilateral subarticular zones and moderate bilateral neural foraminal narrowing. Empty sac appearance of the thecal sac at this level may be related to postsurgical arachnoiditis. IMPRESSION: 1. Motion degraded study. 2. T2 hyperintensity in the L3-4 endplate cyst, thought to be degenerative. However, given small increased T2 signal within the corresponding intervertebral disc, the possibility of discitis/osteomyelitis cannot be entirely excluded although it is felt to be less likely. No epidural abscess identified. 3. Multilevel degenerative changes of the lumbar spine with severe spinal canal stenosis at L3-4 and mild spinal canal stenosis at L2-3. 4. Severe right neural foraminal narrowing at L2-3. Moderate right neural foraminal stenosis at L4-5 and L5-S1. 5. Postsurgical changes from L4-5 and L5-S1 laminectomies. Electronically Signed   By: Pedro Earls M.D.   On: 06/29/2020 21:31    Scheduled Meds: . acetaminophen  650 mg Oral Once  . cholecalciferol  1,000 Units Oral Daily  . insulin aspart  0-9 Units Subcutaneous TID WC  . lidocaine  2 patch Transdermal Q24H  . lisinopril  2.5 mg Oral Daily  . omega-3 acid ethyl esters  1 g Oral Daily  . pyridOXINE   Oral Daily  . rosuvastatin  20 mg Oral QHS   Continuous Infusions:   LOS: 1 day   Time spent: 35 minutes   Darliss Cheney, MD [facility]  06/30/2020, 1:35 PM   To contact the attending provider between 7A-7P or the covering provider during after hours 7P-7A, please log into the web site [email or web address].

## 2020-06-30 NOTE — Progress Notes (Signed)
Patient ID: Samantha [name], female   DOB: 10-27-1950, 70 y.o.   MRN: [id number] BP (!) 106/50 (BP Location: Right Arm)   Pulse 97   Temp 98 F (36.7 C) (Oral)   Resp 16   Ht 5\' 4"  (1.626 m)   Wt 120.2 kg   SpO2 96%   BMI 45.49 kg/m  I was asked on 06/29/2020 at 2240 to provide a consultation on Ms. [name], and render a neurosurgical opinion about possible lumbar osteomyelitis. I was told the patient presented with a fever and low back pain. An MRI had been ordered and performed at 2130. I was told Ms. [name] had no neurological deficits, was not in an immeidate post operative time period, and she did not have an epidural process observed on the MRI to suggest an epidural abscess. I informed the ED physician that although the hospitalist on call wanted a consultation based on the possibility of osteomyelitis in the spine, that I could not identify a neurosurgical problem to provide a consultation on. Osteomyelitis is not a neurosurgical entity when it is not post op, nor causing neurological problems. There is nothing for me to offer this patient in terms of help. I was not told by the ED physician that Ms. [name] was an active patient in the [facility] medical system for her back pain and had been for the last at least 6 years. That she had spoken with her physician/PA on 06/27/2020, and was given an appointment for 07/04/2020. That she has received injections,medications, and again ongoing treatment for her back pain. Thus, I ethically could not consult since the patient did not request a new physician, and once again I offered no insight into her possible osteomyelitis. In short she had no neurosurgical problems based on osteomyelitis, nor an indication that a neurosurgical problem existed. This is what I said to the ED physician.  I was informed this AM that I was expected to leave a consultation since this is what the ED physician had told the admitting physician last night. I unfortunately never said  this to the ED physician.  I was then informed that Dr. 09/01/2020 did not believe that the MRI findings of possible osteomyelitis were correct. He declined to perform the biopsy because he felt the changes observed on the scan were due to degenerative changes. Given her long history of degenerative changes on her MRI I too believe it is unlikely that this represents infection. ID has been consulted which I do feel is appropriate.
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
## 2020-06-30 NOTE — Plan of Care (Signed)

## 2020-07-01 ENCOUNTER — Telehealth: Payer: Self-pay | Admitting: Infectious Diseases

## 2020-07-01 DIAGNOSIS — M545 Low back pain, unspecified: Secondary | ICD-10-CM

## 2020-07-01 LAB — CBC WITH DIFFERENTIAL/PLATELET
Abs Immature Granulocytes: 0.02 10*3/uL (ref 0.00–0.07)
Basophils Absolute: 0 10*3/uL (ref 0.0–0.1)
Basophils Relative: 1 %
Eosinophils Absolute: 0.2 10*3/uL (ref 0.0–0.5)
Eosinophils Relative: 5 %
HCT: 39.9 % (ref 36.0–46.0)
Hemoglobin: 12.6 g/dL (ref 12.0–15.0)
Immature Granulocytes: 1 %
Lymphocytes Relative: 28 %
Lymphs Abs: 1.1 10*3/uL (ref 0.7–4.0)
MCH: 28.4 pg (ref 26.0–34.0)
MCHC: 31.6 g/dL (ref 30.0–36.0)
MCV: 90.1 fL (ref 80.0–100.0)
Monocytes Absolute: 0.5 10*3/uL (ref 0.1–1.0)
Monocytes Relative: 13 %
Neutro Abs: 2.1 10*3/uL (ref 1.7–7.7)
Neutrophils Relative %: 52 %
Platelets: 223 10*3/uL (ref 150–400)
RBC: 4.43 MIL/uL (ref 3.87–5.11)
RDW: 13.2 % (ref 11.5–15.5)
WBC: 4 10*3/uL (ref 4.0–10.5)
nRBC: 0 % (ref 0.0–0.2)

## 2020-07-01 LAB — BASIC METABOLIC PANEL
Anion gap: 11 (ref 5–15)
BUN: 12 mg/dL (ref 8–23)
CO2: 24 mmol/L (ref 22–32)
Calcium: 9.2 mg/dL (ref 8.9–10.3)
Chloride: 101 mmol/L (ref 98–111)
Creatinine, Ser: 0.55 mg/dL (ref 0.44–1.00)
GFR, Estimated: >60 mL/min (ref >60)
Glucose, Bld: 123 mg/dL — ABNORMAL HIGH (ref 70–99)
Potassium: 4.3 mmol/L (ref 3.5–5.1)
Sodium: 136 mmol/L (ref 135–145)

## 2020-07-01 LAB — SEDIMENTATION RATE: Sed Rate: 48 mm/hr — ABNORMAL HIGH (ref 0–22)

## 2020-07-01 LAB — C-REACTIVE PROTEIN: CRP: 7.7 mg/dL — ABNORMAL HIGH (ref <1.0)

## 2020-07-01 LAB — GLUCOSE, CAPILLARY
Glucose-Capillary: 99 mg/dL (ref 70–99)
Glucose-Capillary: 133 mg/dL — ABNORMAL HIGH (ref 70–99)

## 2020-07-01 NOTE — Progress Notes (Signed)
[facility] for Infectious Disease  Date of Admission:  06/29/2020      Total days of antibiotics 0          ASSESSMENT: Samantha [name] is a 70 y.o. female with acutely worsening back pain and isolated fever. Radiology and neurosurgery both favor degenerative process to explain abnormalities in lumbar spine over infection. We agree that infection seems less likely given significant improvement in her pain quickly on her home regimen, as it appears. We also have an alternative explanation for her fever - she had received the [place] booster the day prior to admission. This may also be responsible for her moderately elevated inflammatory markers as well (which are down-trending this AM).    We discussed outpatient management for follow up with repeat MRI in 2 weeks; I have also asked her to keep a log of temperatures 1-2 times a day or if she feels feverish as well as her back pain quality/severity. She and her sister are comfortable with this option.   She has an appt with her established nsgy team 1/10 at [facility] I asked her to keep.   Will plan for continuation of her care outpatient. D/W Dr. Doristine Bosworth and will defer to him for further plans regarding discharge.     PLAN: 1. No antibiotics  2. FU MRI in 2 weeks with ID appt shortly after to review  3. Temperature and back pain logs to be done by the patient  4. Return precautions discussed including unmanaged pain, neurologic changes, signs of hemodynamic instability/sepsis     Principal Problem:   Back pain Active Problems:   Sarcoidosis   HTN (hypertension)   Morbid obesity with BMI of 40.0-44.9, adult (HCC)   Diabetes mellitus type 2 in obese ([place])   . acetaminophen  650 mg Oral Once  . cholecalciferol  1,000 Units Oral Daily  . insulin aspart  0-9 Units Subcutaneous TID WC  . lidocaine  2 patch Transdermal Q24H  . lisinopril  2.5 mg Oral Daily  . omega-3 acid ethyl esters  1 g Oral Daily  . pyridOXINE   Oral  Daily  . rosuvastatin  20 mg Oral QHS    SUBJECTIVE: Back pain is much improved compared to admission. No fevers overnight. No new complaints otherwise. Having trouble getting her sister on the phone. Still with intermittent numbness of the right lateral thigh. But reports that to be better today. Very nervous about IV antibiotics and support she would need for that.    Review of Systems: Review of Systems  Constitutional: Negative for chills, fever and malaise/fatigue.  Respiratory: Negative for cough and shortness of breath.   Cardiovascular: Negative for chest pain and leg swelling.  Gastrointestinal: Negative for abdominal pain, nausea and vomiting.  Genitourinary: Negative for dysuria.  Musculoskeletal: Positive for back pain (improved). Negative for myalgias.  Skin: Negative for rash.    Allergies  Allergen Reactions  . Other Other (See Comments)    NUCLEAR MED CONTRAST/DYE caused cardiac arrest, pulseless, no readings at all  . Amoxicillin-Pot Clavulanate Other (See Comments)    Lethargy Did it involve swelling of the face/tongue/throat, SOB, or low BP? No Did it involve sudden or severe rash/hives, skin peeling, or any reaction on the inside of your mouth or nose? No Did you need to seek medical attention at a hospital or doctor's office? No When did it last happen?unk If all above answers are "NO", may proceed with cephalosporin use.   Marland Kitchen  Cymbalta [Duloxetine Hcl] Other (See Comments)    Pt says it causes her to be "looney" (AMS)  . Metformin Hcl Diarrhea  . Contrast Media [Iodinated Diagnostic Agents] Other (See Comments)    Patient states no reaction to iv dye, MRI dye or other dyes-EXCEPT NUCLEAR MEDICINE CONTRAST/DYE    OBJECTIVE: Vitals:   06/30/20 1300 06/30/20 1900 07/01/20 0400 07/01/20 0907  BP: (!) 106/50 122/65 127/67 120/70  Pulse: 97 (!) 105 91 (!) 101  Resp: 16 16 15 17   Temp: 98 F (36.7 C) 98.6 F (37 C) 98.2 F (36.8 C) 98.8 F (37.1 C)   TempSrc: Oral Oral Oral Oral  SpO2: 96% 96% 94% 95%  Weight:      Height:       Body mass index is 45.49 kg/m.  Physical Exam Vitals and nursing note reviewed.  Constitutional:      Appearance: She is well-developed and well-nourished.     Comments: Seated comfortably in chair.   HENT:     Mouth/Throat:     Mouth: Mucous membranes are normal. No oral lesions.     Dentition: Normal dentition. No dental abscesses.     Pharynx: No oropharyngeal exudate.  Cardiovascular:     Rate and Rhythm: Normal rate and regular rhythm.     Heart sounds: Normal heart sounds.  Pulmonary:     Effort: Pulmonary effort is normal.     Breath sounds: Normal breath sounds.  Abdominal:     General: There is no distension.     Palpations: Abdomen is soft.     Tenderness: There is no abdominal tenderness.  Lymphadenopathy:     Cervical: No cervical adenopathy.  Skin:    General: Skin is warm and dry.     Findings: No rash.  Neurological:     Mental Status: She is alert and oriented to person, place, and time.  Psychiatric:        Mood and Affect: Mood and affect normal.        Judgment: Judgment normal.     Comments: In good spirits today and engaged in care discussion     Lab Results Lab Results  Component Value Date   WBC 4.0 07/01/2020   HGB 12.6 07/01/2020   HCT 39.9 07/01/2020   MCV 90.1 07/01/2020   PLT 223 07/01/2020    Lab Results  Component Value Date   CREATININE 0.55 07/01/2020   BUN 12 07/01/2020   NA 136 07/01/2020   K 4.3 07/01/2020   CL 101 07/01/2020   CO2 24 07/01/2020    Lab Results  Component Value Date   ALT 26 11/15/2019   AST 42 (H) 11/15/2019   ALKPHOS 86 11/15/2019   BILITOT 1.2 11/15/2019     Microbiology: Recent Results (from the past 240 hour(s))  SARS CORONAVIRUS 2 (TAT 6-24 HRS) Nasopharyngeal Nasopharyngeal Swab     Status: None   Collection Time: 06/29/20  4:25 PM   Specimen: Nasopharyngeal Swab  Result Value Ref Range Status   SARS  Coronavirus 2 NEGATIVE NEGATIVE Final    Comment: (NOTE) SARS-CoV-2 target nucleic acids are NOT DETECTED.  The SARS-CoV-2 RNA is generally detectable in upper and lower respiratory specimens during the acute phase of infection. Negative results do not preclude SARS-CoV-2 infection, do not rule out co-infections with other pathogens, and should not be used as the sole basis for treatment or other patient management decisions. Negative results must be combined with clinical observations, patient history, and epidemiological  information. The expected result is Negative.  Fact Sheet for Patients: SugarRoll.be  Fact Sheet for Healthcare Providers: https://www.woods-mathews.com/  This test is not yet approved or cleared by the [place] FDA and  has been authorized for detection and/or diagnosis of SARS-CoV-2 by FDA under an Emergency Use Authorization (EUA). This EUA will remain  in effect (meaning this test can be used) for the duration of the COVID-19 declaration under Se ction 564(b)(1) of the Act, 21 U.S.C. section 360bbb-3(b)(1), unless the authorization is terminated or revoked sooner.  Performed at [facility], [place] [address] [place], [place] [address]   Blood culture (routine x 2)     Status: None (Preliminary result)   Collection Time: 06/29/20 11:35 PM   Specimen: BLOOD LEFT HAND  Result Value Ref Range Status   Specimen Description BLOOD LEFT HAND  Final   Special Requests   Final    BOTTLES DRAWN AEROBIC ONLY Blood Culture results may not be optimal due to an inadequate volume of blood received in culture bottles   Culture   Final    NO GROWTH < 12 HOURS Performed at [facility], [place] [address], [place], [place] [address]    Report Status PENDING  Incomplete  Blood culture (routine x 2)     Status: None (Preliminary result)   Collection Time: 06/30/20 12:03 AM   Specimen: BLOOD  Result Value Ref Range  Status   Specimen Description BLOOD RIGHT ANTECUBITAL  Final   Special Requests   Final    BOTTLES DRAWN AEROBIC ONLY Blood Culture results may not be optimal due to an inadequate volume of blood received in culture bottles   Culture   Final    NO GROWTH < 12 HOURS Performed at [facility], [place] [address], [place], [place] [address]    Report Status PENDING  Incomplete     Janene Madeira, MSN, NP-C [place] [facility] for Infectious Disease [email or web address] Pager: [phone] Office: [phone] [place]: [phone]

## 2020-07-01 NOTE — Evaluation (Signed)
Occupational Therapy Evaluation and Discharge Patient Details Name: Samantha [name] MRN: [id number] DOB: 1950-11-02 Today's Date: 07/01/2020    History of Present Illness Samantha [name] is a 70 y.o. female with history of diabetes mellitus type 2, hypertension, hyperlipidemia and morbid obesity and sarcoidosis presents to the ER because of worsening back pain ongoing for the last 1 week.   Clinical Impression   This 70 yo female admitted with above presents to acute OT with pt reporting she can get to bathroom by herself with RW and staff just standing by (because I am to call them if I need to get up) and she has all needed DME at home except RW (which has been recommended). She does not foresee any issues with doing her basic ADLs and politely declined needing to try/practice anything we had discussed. No further OT needs, we will sign off.    Follow Up Recommendations  No OT follow up;Supervision - Intermittent    Equipment Recommendations  None recommended by OT       Precautions / Restrictions Precautions Precautions: Fall Restrictions Weight Bearing Restrictions: No             ADL either performed or assessed with clinical judgement   ADL                                         General ADL Comments: Pt reports she has been getting up to bathroom by herself with only staff standing by to make sure she is okay. She reports at home she has a toilet riser, a tub transfer bench, and a hand held shower. She said she would benefit from a new reacher (had one when she had knee surgery in past). Made her aware where she could get one. She said she can manage getting socks on okay. Asked her if she felt the need to practice anything we had talked about and she said she did not believe so that she can manage her basic ADLs.     Vision Baseline Vision/History: Wears glasses Wears Glasses: At all times Patient Visual Report: No change from baseline               Pertinent Vitals/Pain Pain Assessment: Faces Faces Pain Scale: Hurts little more Pain Location: back Pain Descriptors / Indicators: Grimacing;Guarding Pain Intervention(s): Monitored during session     Hand Dominance Right   Extremity/Trunk Assessment Upper Extremity Assessment Upper Extremity Assessment: Overall WFL for tasks assessed           Communication Communication Communication: No difficulties   Cognition Arousal/Alertness: Awake/alert Behavior During Therapy: WFL for tasks assessed/performed Overall Cognitive Status: Within Functional Limits for tasks assessed                                                Home Living Family/patient expects to be discharged to:: Private residence Living Arrangements: Alone Available Help at Discharge: Friend(s);Available PRN/intermittently Type of Home: Apartment       Home Layout: One level     Bathroom Shower/Tub: Tub/shower unit;Curtain   Biochemist, clinical: Standard     Home Equipment: Cane - quad;Cane - single point;Tub bench;Toilet riser;Hand held shower head          Prior Functioning/Environment Level of Independence:  Independent with assistive device(s)                 OT Problem List: Pain         OT Goals(Current goals can be found in the care plan section) Acute Rehab OT Goals Patient Stated Goal: to go home today                AM-PAC OT "6 Clicks" Daily Activity     Outcome Measure Help from another person eating meals?: None Help from another person taking care of personal grooming?: None Help from another person toileting, which includes using toliet, bedpan, or urinal?: None Help from another person bathing (including washing, rinsing, drying)?: None Help from another person to put on and taking off regular upper body clothing?: None Help from another person to put on and taking off regular lower body clothing?: None 6 Click Score: 24   End of Session     Activity Tolerance: Patient tolerated treatment well Patient left: in bed  OT Visit Diagnosis: Pain Pain - part of body:  (back)                Time: 2297-9892 OT Time Calculation (min): 11 min Charges:  OT General Charges $OT Visit: 1 Visit OT Evaluation $OT Eval Low Complexity: 1 Low  [place] Circle, OTR/L Acute [employer] Pager [phone] Office [phone]    Almon Register 07/01/2020, 1:36 PM

## 2020-07-01 NOTE — TOC Initial Note (Addendum)
Transition of Care [facility]) - Initial/Assessment Note    Patient Details  Name: Samantha [name] MRN: [id number] Date of Birth: 1950-10-06  Transition of Care [facility]) CM/SW Contact:    Benard Halsted, LCSW Phone Number: 07/01/2020, 1:30 PM  Clinical Narrative:                 CSW received consult for possible outpatient therapy but patient reports that therapy saw her today and is recommending home health services at time of discharge since she cannot get to outpatient. CSW spoke with patient. Patient reported that she would like home health services. CSW sent referral for review with [facility]. CSW provided Medicare [place] ratings list. CSW discussed equipment needs with patient and she requested a rolling walker, which RN will assist with obtaining. CSW confirmed PCP and address with patient.  UPDATE: Alvis Lemmings able to accept for home health PT. OT recommended no follow up.   Expected Discharge Plan: [place]     Patient Goals and CMS Choice Patient states their goals for this hospitalization and ongoing recovery are:: Return home CMS Medicare.gov Compare Post Acute Care list provided to:: Patient Choice offered to / list presented to : Patient  Expected Discharge Plan and Services Expected Discharge Plan: [place]   Discharge Planning Services: CM Consult Post Acute Care Choice: [place] arrangements for the past 2 months: Apartment Expected Discharge Date: 07/01/20               DME Arranged: Gilford Rile rolling DME Agency: [employer] Date DME Agency Contacted: 07/01/20   Representative spoke with at DME Agency: [place]: PT [place]: [place] Date [place]: 07/01/20 Time [place]: 46 Representative spoke with at [place]: Georgina Snell  Prior Living Arrangements/Services Living arrangements for the past 2 months: Apartment Lives with:: Self Patient language and need for interpreter reviewed::  Yes Do you feel safe going back to the place where you live?: Yes      Need for Family Participation in Patient Care: No (Comment) Care giver support system in place?: Yes (comment)   Criminal Activity/Legal Involvement Pertinent to Current Situation/Hospitalization: No - Comment as needed  Activities of Daily Living Home Assistive Devices/Equipment: Walker (specify type),Cane (specify quad or straight) ADL Screening (condition at time of admission) Patient's cognitive ability adequate to safely complete daily activities?: Yes Is the patient deaf or have difficulty hearing?: No Does the patient have difficulty seeing, even when wearing glasses/contacts?: No Does the patient have difficulty concentrating, remembering, or making decisions?: No Patient able to express need for assistance with ADLs?: Yes Does the patient have difficulty dressing or bathing?: No Independently performs ADLs?: Yes (appropriate for developmental age) Does the patient have difficulty walking or climbing stairs?: Yes Weakness of Legs: None Weakness of Arms/Hands: None  Permission Sought/Granted Permission sought to share information with : Facility Art therapist granted to share information with : Yes, Verbal Permission Granted     Permission granted to share info w AGENCY: HH        Emotional Assessment Appearance:: Appears stated age Attitude/Demeanor/Rapport: Engaged Affect (typically observed): Accepting,Appropriate Orientation: : Oriented to Self,Oriented to Place,Oriented to  Time,Oriented to Situation Alcohol / Substance Use: Not Applicable Psych Involvement: No (comment)  Admission diagnosis:  Back pain [M54.9] Discitis [M46.40] Mid back pain [M54.9] Acute bilateral low back pain without sciatica [M54.50] Patient Active Problem List   Diagnosis Date Noted  . Back pain  06/29/2020  . Diabetes mellitus type 2 in obese ([place]) 06/29/2020  . 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 Pain  in the chest   . Chest pain 07/04/2014  . Sarcoidosis 07/04/2014  . HTN (hypertension) 07/04/2014  . HLD (hyperlipidemia) 07/04/2014  . Morbid obesity with BMI of 40.0-44.9, adult ([place]) 07/04/2014   PCP:  Kathyrn Lass, MD Pharmacy:  No Pharmacies Listed    Social Determinants of Health (SDOH) Interventions    Readmission Risk Interventions No flowsheet data found.

## 2020-07-01 NOTE — Telephone Encounter (Signed)
Samantha [name] can you please help schedule [name] repeat MRI scan in 2 weeks and a follow up with Dr. Juleen China or myself after to review?  The order is in.   Thank you! She will be discharged home hopefully tomorrow

## 2020-07-01 NOTE — Discharge Instructions (Signed)
As discussed, your back is pain is likely muscular in nature given reproducible pain on exam. Continue taking your percocet as prescribed. I am sending you home with naproxen and a muscle relaxer called Robaxin.  Take as prescribed and as needed for pain.  Muscle relaxer can cause drowsiness do not drive or operate machinery while on the medication.  Please follow-up with PCP if symptoms not improved within the next week.  You may also purchase over-the-counter Lidoderm patches and Voltaren gel for added pain relief. Return to the ER for new or worsening symptoms.

## 2020-07-01 NOTE — Plan of Care (Signed)
  Problem: Education: Goal: Knowledge of General Education information will improve Description: Including pain rating scale, medication(s)/side effects and non-pharmacologic comfort measures Outcome: Adequate for Discharge   Problem: Health Behavior/Discharge Planning: Goal: Ability to manage health-related needs will improve Outcome: Adequate for Discharge   Problem: Clinical Measurements: Goal: Ability to maintain clinical measurements within normal limits will improve Outcome: Adequate for Discharge Goal: Will remain free from infection Outcome: Adequate for Discharge Goal: Diagnostic test results will improve Outcome: Adequate for Discharge Goal: Respiratory complications will improve Outcome: Adequate for Discharge Goal: Cardiovascular complication will be avoided Outcome: Adequate for Discharge   Problem: Activity: Goal: Risk for activity intolerance will decrease Outcome: Adequate for Discharge   Problem: Nutrition: Goal: Adequate nutrition will be maintained Outcome: Adequate for Discharge   Problem: Coping: Goal: Level of anxiety will decrease Outcome: Adequate for Discharge   Problem: Elimination: Goal: Will not experience complications related to bowel motility Outcome: Adequate for Discharge Goal: Will not experience complications related to urinary retention Outcome: Adequate for Discharge   Problem: Pain Managment: Goal: General experience of comfort will improve Outcome: Adequate for Discharge   Problem: Safety: Goal: Ability to remain free from injury will improve Outcome: Adequate for Discharge   Problem: Skin Integrity: Goal: Risk for impaired skin integrity will decrease Outcome: Adequate for Discharge   Problem: Acute Rehab PT Goals(only PT should resolve) Goal: Patient Will Transfer Sit To/From Stand Outcome: Adequate for Discharge Goal: Pt Will Transfer Bed To Chair/Chair To Bed Outcome: Adequate for Discharge Goal: Pt Will  Ambulate Outcome: Adequate for Discharge Goal: Pt/caregiver will Perform Home Exercise Program Outcome: Adequate for Discharge

## 2020-07-01 NOTE — Progress Notes (Signed)
Physical Therapy Treatment Patient Details Name: Samantha [name] MRN: [id number] DOB: [birth year] Today's Date: 07/01/2020    History of Present Illness Samantha [name] is a 70 y.o. female with history of diabetes mellitus type 2, hypertension, hyperlipidemia and morbid obesity and sarcoidosis presents to the ER because of worsening back pain ongoing for the last 1 week.    PT Comments    Session limited by fatigue due to patient previously ambulating 250' with NT prior to arrival. Encouraged patient for short ambulation distance with patient agreeable. Patient ambulated 25' with RW and min guard progressing to supervision. Patient continues to be limited by back pain, decreased activity tolerance, impaired balance, generalized weakness. Recommend HHPT following discharge to maximize functional independence and safety in the home.     Follow Up Recommendations  Home health PT;Supervision - Intermittent     Equipment Recommendations  Rolling Ramesh Moan with 5" wheels    Recommendations for Other Services       Precautions / Restrictions Precautions Precautions: Fall Restrictions Weight Bearing Restrictions: No    Mobility  Bed Mobility Overal bed mobility: Modified Independent             General bed mobility comments: with HOB flat  Transfers Overall transfer level: Needs assistance Equipment used: Rolling Blaze Nylund (2 wheeled) Transfers: Sit to/from Stand Sit to Stand: Min guard         General transfer comment: min guard for safety, cues for hand placement  Ambulation/Gait Ambulation/Gait assistance: Min guard;Supervision Gait Distance (Feet): 25 Feet Assistive device: Rolling Jissell Trafton (2 wheeled) Gait Pattern/deviations: Step-through pattern;Decreased stride length;Trunk flexed Gait velocity: decreased   General Gait Details: initially min guard progressing to supervision. Patient had previously walked 250' with NT and was very fatigue. Agreeable to short bout of  ambulation with PT.   Stairs             Wheelchair Mobility    Modified Rankin (Stroke Patients Only)       Balance Overall balance assessment: Needs assistance Sitting-balance support: Feet supported Sitting balance-Leahy Scale: Fair     Standing balance support: Bilateral upper extremity supported Standing balance-Leahy Scale: Poor Standing balance comment: reliant on UE support                            Cognition Arousal/Alertness: Awake/alert Behavior During Therapy: WFL for tasks assessed/performed Overall Cognitive Status: Within Functional Limits for tasks assessed                                        Exercises      General Comments        Pertinent Vitals/Pain Pain Assessment: Faces Faces Pain Scale: Hurts little more Pain Location: back Pain Descriptors / Indicators: Grimacing;Guarding Pain Intervention(s): Monitored during session    Home Living                      Prior Function            PT Goals (current goals can now be found in the care plan section) Acute Rehab PT Goals Patient Stated Goal: I want to have less pain PT Goal Formulation: With patient Time For Goal Achievement: 07/14/20 Potential to Achieve Goals: Good Progress towards PT goals: Progressing toward goals    Frequency    Min 3X/week  PT Plan Current plan remains appropriate    Co-evaluation              AM-PAC PT "6 Clicks" Mobility   Outcome Measure  Help needed turning from your back to your side while in a flat bed without using bedrails?: None Help needed moving from lying on your back to sitting on the side of a flat bed without using bedrails?: None Help needed moving to and from a bed to a chair (including a wheelchair)?: A Little Help needed standing up from a chair using your arms (e.g., wheelchair or bedside chair)?: A Little Help needed to walk in hospital room?: A Little Help needed climbing  3-5 steps with a railing? : A Lot 6 Click Score: 19    End of Session Equipment Utilized During Treatment: Gait belt Activity Tolerance: Patient limited by fatigue Patient left: in bed;with call bell/phone within reach Nurse Communication: Mobility status PT Visit Diagnosis: Pain;Unsteadiness on feet (R26.81);Muscle weakness (generalized) (M62.81);Other abnormalities of gait and mobility (R26.89) Pain - part of body:  (back)     Time: 1242-1310 PT Time Calculation (min) (ACUTE ONLY): 28 min  Charges:  $Therapeutic Activity: 23-37 mins                     Michelene Keniston A. Gilford Rile PT, DPT Acute Rehabilitation Services Pager [phone] Office [phone]    Alda Lea 07/01/2020, 1:31 PM

## 2020-07-01 NOTE — Discharge Summary (Signed)
Physician Discharge Summary  Samantha [name] EGB:[id number] DOB: 1950/10/24 DOA: 06/29/2020  PCP: Samantha Lass, MD  Admit date: 06/29/2020 Discharge date: 07/01/2020  Admitted From: Home Disposition: Home  Recommendations for Outpatient Follow-up:  1. Follow up with PCP in 1-2 weeks 2. Follow-up with ID in 2 weeks 3. Please obtain BMP/CBC in one week 4. Please follow up with your PCP on the following pending results: Unresulted Labs (From admission, onward)          Start     Ordered   07/01/20 0743  Culture, blood (Routine X 2) w Reflex to ID Panel  BLOOD CULTURE X 2,   R (with TIMED occurrences)      07/01/20 Samantha [name]: Yes Equipment/Devices: Rolling walker  Discharge Condition: Stable CODE STATUS: Full code Diet recommendation: Cardiac  Subjective: Seen and examined. Still has back pain but improving. She is able to move. No other complaint.  PXT:GGYIRSWN Samantha [name] a 70 y.o.femalewithhistory of diabetes mellitus type 2, hypertension, hyperlipidemia and morbid obesity and sarcoidosis presents to the ER because of worsening back pain ongoing for the last 1 week. Patient states the pain has been worsening making it difficult to walk. Denies any incontinence of urine or bowel. Patient is still able to walk with the help of a cane.  ED Course:In the ER patient was febrile with temperature 100.8 F. MRI of the T-spine and L-spine were done there was some changes at T2 showing hyperintensity concerning for possible discitis versus osteomyelitis. ER physician discussed with on-call neurosurgeon Dr. Christella Noa who advised getting interventional radiology for biopsy and blood cultures following which and wished to consider antibiotics. Labs are largely unremarkable. Covid testing negative.  Brief/Interim Summary: Patient was admitted to hospital service for Lower back pain, Initial read of the MRI showed T-spine showing hyperintensity at T2 area for which we have  to rule out osteomyelitis/discitis. ER physician discussed with on-call neurosurgeon Dr. Christella Noa who advised getting interventional neurology for biopsy/aspiration and cultures.  IR was consulted however they opined that there is no strong evidence of discitis or osteomyelitis and thus deferred the procedure. I called radiology and spoke to Dr. Macy Mis who was gracious enough to read MRI once again and provided me a second opinion and according to him, this is more like a degenerative disc disease and he also believes that there is NO strong evidence of osteomyelitis. I then also discussed with Dr. Christella Noa of neurosurgery who has deferred to radiology.  Patient had elevated CRP and ESR however very mild fever of 100.6 and no leukocytosis.  From chemistry perspective, she also does not have a strong evidence of osteomyelitis. It was later learned that patient recently had her COVID-19 vaccination which can cause fever and elevated inflammatory markers.  Due to no clear evidence in favor or against osteomyelitis, ID was consulted. ID provided 3 options to the patient which included, to go ahead and get the biopsy done which would likely be low yield and that negative biopsy does not rule out infection with confidence. Option 2 was to treat her with IV antibiotics for 6 weeks and then option 3 was follow-up with ID in 2 weeks with repeat MRI. ID discussed with patient and her sister in detail and finally they decided to pursue the option 3 which will be to follow-up in 2 weeks with repeat MRI. Patient did not have any further fever and white cells remained normal. She was evaluated  by PT who recommended home health PT as well as rolling walker which is being arranged for her and then patient will be discharged in stable condition today.   Discharge Diagnoses:  Principal Problem:   Back pain Active Problems:   Sarcoidosis   HTN (hypertension)   Morbid obesity with BMI of 40.0-44.9, adult (HCC)    Diabetes mellitus type 2 in obese [facility])    Discharge Instructions   Allergies as of 07/01/2020      Reactions   Other Other (See Comments)   NUCLEAR MED CONTRAST/DYE caused cardiac arrest, pulseless, no readings at all   Amoxicillin-pot Clavulanate Other (See Comments)   Lethargy Did it involve swelling of the face/tongue/throat, SOB, or low BP? No Did it involve sudden or severe rash/hives, skin peeling, or any reaction on the inside of your mouth or nose? No Did you need to seek medical attention at a hospital or doctor's office? No When did it last happen?unk If all above answers are "NO", may proceed with cephalosporin use.   Cymbalta [duloxetine Hcl] Other (See Comments)   Pt says it causes her to be "looney" (AMS)   Metformin Hcl Diarrhea   Contrast Media [iodinated Diagnostic Agents] Other (See Comments)   Patient states no reaction to iv dye, MRI dye or other dyes-EXCEPT NUCLEAR MEDICINE CONTRAST/DYE      Medication List    TAKE these medications   Accu-Chek Aviva Plus test strip Generic drug: glucose blood   Accu-Chek Softclix Lancets lancets   acetaminophen 500 MG tablet Commonly known as: TYLENOL Take 1,000 mg by mouth daily as needed for mild pain or headache.   aspirin 81 MG tablet Take 81 mg by mouth at bedtime.   B-12 PO Take 1 tablet by mouth daily.   B-6 PO Take 1 tablet by mouth daily.   cholecalciferol 25 MCG (1000 UNIT) tablet Commonly known as: VITAMIN D3 Take 1,000 Units by mouth in the morning and at bedtime.   CRANBERRY PO Take 2 tablets by mouth daily.   cyclobenzaprine 10 MG tablet Commonly known as: FLEXERIL Take 5-10 mg by mouth in the morning and at bedtime.   diclofenac Sodium 1 % Gel Commonly known as: VOLTAREN Apply 1 application topically 4 (four) times daily as needed (Joint pain).   fish oil-omega-3 fatty acids 1000 MG capsule Take 1 g by mouth daily.   fluticasone 50 MCG/ACT nasal spray Commonly known as:  FLONASE Place 2 sprays into both nostrils daily as needed for allergies.   furosemide 40 MG tablet Commonly known as: LASIX Take 20 mg by mouth 2 (two) times a week. No set days   glimepiride 2 MG tablet Commonly known as: AMARYL Take 2 mg by mouth daily.   lisinopril 2.5 MG tablet Commonly known as: ZESTRIL Take 2.5 mg by mouth daily.   loratadine 10 MG tablet Commonly known as: CLARITIN Take 10 mg by mouth daily.   methocarbamol 500 MG tablet Commonly known as: ROBAXIN Take 1 tablet (500 mg total) by mouth 2 (two) times daily.   naproxen 500 MG tablet Commonly known as: NAPROSYN Take 1 tablet (500 mg total) by mouth 2 (two) times daily.   oxyCODONE-acetaminophen 7.5-325 MG tablet Commonly known as: PERCOCET Take 1 tablet by mouth every 6 (six) hours as needed for moderate pain.   PROBIOTIC PO Take 1 capsule by mouth daily.   rosuvastatin 20 MG tablet Commonly known as: CRESTOR Take 20 mg by mouth at bedtime.  Durable Medical Equipment  (From admission, onward)         Start     Ordered   07/01/20 1339  For home use only DME Walker rolling  Once       Question Answer Comment  Walker: With [place] Wheels   Patient needs a walker to treat with the following condition Back pain      07/01/20 1338          Follow-up Information    Samantha Lass, MD Follow up in 1 week(s).   Specialty: Family Medicine Contact information: [place] [address] [phone]              Allergies  Allergen Reactions  . Other Other (See Comments)    NUCLEAR MED CONTRAST/DYE caused cardiac arrest, pulseless, no readings at all  . Amoxicillin-Pot Clavulanate Other (See Comments)    Lethargy Did it involve swelling of the face/tongue/throat, SOB, or low BP? No Did it involve sudden or severe rash/hives, skin peeling, or any reaction on the inside of your mouth or nose? No Did you need to seek medical attention at a hospital or  doctor's office? No When did it last happen?unk If all above answers are "NO", may proceed with cephalosporin use.   Marland Kitchen Cymbalta [Duloxetine Hcl] Other (See Comments)    Pt says it causes her to be "looney" (AMS)  . Metformin Hcl Diarrhea  . Contrast Media [Iodinated Diagnostic Agents] Other (See Comments)    Patient states no reaction to iv dye, MRI dye or other dyes-EXCEPT NUCLEAR MEDICINE CONTRAST/DYE    Consultations: IR, ID,   Procedures/Studies: MR THORACIC SPINE W WO CONTRAST  Result Date: 06/29/2020 CLINICAL DATA:  Epidural abscess. Low back pain, infection suspected. EXAM: MRI THORACIC AND LUMBAR SPINE WITHOUT AND WITH CONTRAST TECHNIQUE: Multiplanar and multiecho pulse sequences of the thoracic and lumbar spine were obtained without and with intravenous contrast. CONTRAST:  23m GADAVIST GADOBUTROL 1 MMOL/ML IV SOLN COMPARISON:  MRI of the lumbar spine November 24, 2015. FINDINGS: The study is degraded by motion. MRI THORACIC SPINE FINDINGS Alignment:  Normal anteroposterior alignment. Vertebrae: No fracture, evidence of discitis, or bone lesion. Cord:  Normal signal and morphology. Paraspinal and other soft tissues: Negative. Disc levels: Posterior disc protrusions at T3-4, T4-5, T5-6, T6-7, T7-8, T8-9, T9-10, T10 10 11 and T11-12 causing indentation on the thecal sac without high-grade spinal canal or neural foraminal stenosis. No epidural abscess identified. MRI LUMBAR SPINE FINDINGS Segmentation:  Standard. Alignment:  Small anterolisthesis of L3 over L4. Vertebrae: T2 hyperintensity in the L3-4 endplate cyst thought to degenerative. However, given small increased T2 signal within the corresponding intervertebral disc, the possibility of discitis/osteomyelitis cannot be entirely excluded. No epidural abscess identified. No acute fracture or aggressive bone lesion. Postsurgical changes from L4-5 L5-S1 laminectomy. Conus medullaris: Extends to the L1 level and appears normal. Paraspinal  and other soft tissues: Negative. Disc levels: T12-L1: No spinal canal or neural foraminal stenosis. L1-2: No spinal canal or neural foraminal stenosis. L2-3: Disc bulge, hypertrophic facet degenerative changes with bilateral joint effusion and ligamentum flavum redundancy resulting in mild spinal canal stenosis, severe right and mild left neural foraminal narrowing. L3-4: Disc bulge, hypertrophic facet degenerative changes resulting in severe spinal canal stenosis, moderate right and severe left neural foraminal stenosis. Findings are progressed from prior MRI. L4-5: Disc bulge with superimposed central disc protrusion and facet degenerative changes resulting in narrowing of the bilateral subarticular zones, moderate right and  mild left neural foraminal narrowing. L5-S1: Disc bulge and facet degenerative changes resulting in narrowing of the bilateral subarticular zones and moderate bilateral neural foraminal narrowing. Empty sac appearance of the thecal sac at this level may be related to postsurgical arachnoiditis. IMPRESSION: 1. Motion degraded study. 2. T2 hyperintensity in the L3-4 endplate cyst, thought to be degenerative. However, given small increased T2 signal within the corresponding intervertebral disc, the possibility of discitis/osteomyelitis cannot be entirely excluded although it is felt to be less likely. No epidural abscess identified. 3. Multilevel degenerative changes of the lumbar spine with severe spinal canal stenosis at L3-4 and mild spinal canal stenosis at L2-3. 4. Severe right neural foraminal narrowing at L2-3. Moderate right neural foraminal stenosis at L4-5 and L5-S1. 5. Postsurgical changes from L4-5 and L5-S1 laminectomies. Electronically Signed   By: Baldemar Lenis M.D.   On: 06/29/2020 21:31   MR Lumbar Spine W Wo Contrast  Result Date: 06/29/2020 CLINICAL DATA:  Epidural abscess. Low back pain, infection suspected. EXAM: MRI THORACIC AND LUMBAR SPINE WITHOUT AND  WITH CONTRAST TECHNIQUE: Multiplanar and multiecho pulse sequences of the thoracic and lumbar spine were obtained without and with intravenous contrast. CONTRAST:  51mL GADAVIST GADOBUTROL 1 MMOL/ML IV SOLN COMPARISON:  MRI of the lumbar spine November 24, 2015. FINDINGS: The study is degraded by motion. MRI THORACIC SPINE FINDINGS Alignment:  Normal anteroposterior alignment. Vertebrae: No fracture, evidence of discitis, or bone lesion. Cord:  Normal signal and morphology. Paraspinal and other soft tissues: Negative. Disc levels: Posterior disc protrusions at T3-4, T4-5, T5-6, T6-7, T7-8, T8-9, T9-10, T10 10 11 and T11-12 causing indentation on the thecal sac without high-grade spinal canal or neural foraminal stenosis. No epidural abscess identified. MRI LUMBAR SPINE FINDINGS Segmentation:  Standard. Alignment:  Small anterolisthesis of L3 over L4. Vertebrae: T2 hyperintensity in the L3-4 endplate cyst thought to degenerative. However, given small increased T2 signal within the corresponding intervertebral disc, the possibility of discitis/osteomyelitis cannot be entirely excluded. No epidural abscess identified. No acute fracture or aggressive bone lesion. Postsurgical changes from L4-5 L5-S1 laminectomy. Conus medullaris: Extends to the L1 level and appears normal. Paraspinal and other soft tissues: Negative. Disc levels: T12-L1: No spinal canal or neural foraminal stenosis. L1-2: No spinal canal or neural foraminal stenosis. L2-3: Disc bulge, hypertrophic facet degenerative changes with bilateral joint effusion and ligamentum flavum redundancy resulting in mild spinal canal stenosis, severe right and mild left neural foraminal narrowing. L3-4: Disc bulge, hypertrophic facet degenerative changes resulting in severe spinal canal stenosis, moderate right and severe left neural foraminal stenosis. Findings are progressed from prior MRI. L4-5: Disc bulge with superimposed central disc protrusion and facet degenerative  changes resulting in narrowing of the bilateral subarticular zones, moderate right and mild left neural foraminal narrowing. L5-S1: Disc bulge and facet degenerative changes resulting in narrowing of the bilateral subarticular zones and moderate bilateral neural foraminal narrowing. Empty sac appearance of the thecal sac at this level may be related to postsurgical arachnoiditis. IMPRESSION: 1. Motion degraded study. 2. T2 hyperintensity in the L3-4 endplate cyst, thought to be degenerative. However, given small increased T2 signal within the corresponding intervertebral disc, the possibility of discitis/osteomyelitis cannot be entirely excluded although it is felt to be less likely. No epidural abscess identified. 3. Multilevel degenerative changes of the lumbar spine with severe spinal canal stenosis at L3-4 and mild spinal canal stenosis at L2-3. 4. Severe right neural foraminal narrowing at L2-3. Moderate right neural foraminal stenosis at L4-5  and L5-S1. 5. Postsurgical changes from L4-5 and L5-S1 laminectomies. Electronically Signed   By: Pedro Earls M.D.   On: 06/29/2020 21:31      Discharge Exam: Vitals:   07/01/20 0400 07/01/20 0907  BP: 127/67 120/70  Pulse: 91 (!) 101  Resp: 15 17  Temp: 98.2 F (36.8 C) 98.8 F (37.1 C)  SpO2: 94% 95%   Vitals:   06/30/20 1300 06/30/20 1900 07/01/20 0400 07/01/20 0907  BP: (!) 106/50 122/65 127/67 120/70  Pulse: 97 (!) 105 91 (!) 101  Resp: _0 Temp: 98 F (36.7 C) 98.6 F (37 C) 98.2 F (36.8 C) 98.8 F (37.1 C)  TempSrc: Oral Oral Oral Oral  SpO2: 96% 96% 94% 95%  Weight:      Height:        General: Pt is alert, awake, not in acute distress Cardiovascular: RRR, S1/S2 +, no rubs, no gallops Respiratory: CTA bilaterally, no wheezing, no rhonchi Abdominal: Soft, NT, ND, bowel sounds + Extremities: no edema, no cyanosis    The results of significant diagnostics from this hospitalization (including imaging,  microbiology, ancillary and laboratory) are listed below for reference.     Microbiology: Recent Results (from the past 240 hour(s))  SARS CORONAVIRUS 2 (TAT 6-24 HRS) Nasopharyngeal Nasopharyngeal Swab     Status: None   Collection Time: 06/29/20  4:25 PM   Specimen: Nasopharyngeal Swab  Result Value Ref Range Status   SARS Coronavirus 2 NEGATIVE NEGATIVE Final    Comment: (NOTE) SARS-CoV-2 target nucleic acids are NOT DETECTED.  The SARS-CoV-2 RNA is generally detectable in upper and lower respiratory specimens during the acute phase of infection. Negative results do not preclude SARS-CoV-2 infection, do not rule out co-infections with other pathogens, and should not be used as the sole basis for treatment or other patient management decisions. Negative results must be combined with clinical observations, patient history, and epidemiological information. The expected result is Negative.  Fact Sheet for Patients: SugarRoll.be  Fact Sheet for Healthcare Providers: https://www.woods-mathews.com/  This test is not yet approved or cleared by the [place] FDA and  has been authorized for detection and/or diagnosis of SARS-CoV-2 by FDA under an Emergency Use Authorization (EUA). This EUA will remain  in effect (meaning this test can be used) for the duration of the COVID-19 declaration under Se ction 564(b)(1) of the Act, 21 U.S.C. section 360bbb-3(b)(1), unless the authorization is terminated or revoked sooner.  Performed at [facility], [place] [address] [place], [place] [address]   Blood culture (routine x 2)     Status: None (Preliminary result)   Collection Time: 06/29/20 11:35 PM   Specimen: BLOOD LEFT HAND  Result Value Ref Range Status   Specimen Description BLOOD LEFT HAND  Final   Special Requests   Final    BOTTLES DRAWN AEROBIC ONLY Blood Culture results may not be optimal due to an inadequate volume of blood  received in culture bottles   Culture   Final    NO GROWTH < 12 HOURS Performed at [facility], [place] [address], [place], [place] [address]    Report Status PENDING  Incomplete  Blood culture (routine x 2)     Status: None (Preliminary result)   Collection Time: 06/30/20 12:03 AM   Specimen: BLOOD  Result Value Ref Range Status   Specimen Description BLOOD RIGHT ANTECUBITAL  Final   Special Requests   Final  BOTTLES DRAWN AEROBIC ONLY Blood Culture results may not be optimal due to an inadequate volume of blood received in culture bottles   Culture   Final    NO GROWTH < 12 HOURS Performed at [place] [address] [place], [place] [address]    Report Status PENDING  Incomplete     Labs: BNP (last 3 results) No results for input(s): BNP in the last 8760 hours. Basic Metabolic Panel: Recent Labs  Lab 06/29/20 1626 07/01/20 0219  NA 134* 136  K 3.7 4.3  CL 98 101  CO2 24 24  GLUCOSE 95 123*  BUN 9 12  CREATININE 0.62 0.55  CALCIUM 9.7 9.2   Liver Function Tests: No results for input(s): AST, ALT, ALKPHOS, BILITOT, PROT, ALBUMIN in the last 168 hours. No results for input(s): LIPASE, AMYLASE in the last 168 hours. No results for input(s): AMMONIA in the last 168 hours. CBC: Recent Labs  Lab 06/29/20 1626 07/01/20 0219  WBC 6.2 4.0  NEUTROABS 4.1 2.1  HGB 14.0 12.6  HCT 42.5 39.9  MCV 88.4 90.1  PLT 244 223   Cardiac Enzymes: No results for input(s): CKTOTAL, CKMB, CKMBINDEX, TROPONINI in the last 168 hours. BNP: Invalid input(s): POCBNP CBG: Recent Labs  Lab 06/30/20 1217 06/30/20 1639 06/30/20 2052 07/01/20 0624 07/01/20 1144  GLUCAP 117* 126* 134* 99 133*   D-Dimer No results for input(s): DDIMER in the last 72 hours. Hgb A1c No results for input(s): HGBA1C in the last 72 hours. Lipid Profile No results for input(s): CHOL, HDL, LDLCALC, TRIG, CHOLHDL, LDLDIRECT in the last 72 hours. Thyroid function studies No results for  input(s): TSH, T4TOTAL, T3FREE, THYROIDAB in the last 72 hours.  Invalid input(s): FREET3 Anemia work up No results for input(s): VITAMINB12, FOLATE, FERRITIN, TIBC, IRON, RETICCTPCT in the last 72 hours. Urinalysis    Component Value Date/Time   COLORURINE YELLOW 06/29/2020 1626   APPEARANCEUR CLEAR 06/29/2020 1626   LABSPEC 1.008 06/29/2020 1626   PHURINE 6.0 06/29/2020 1626   GLUCOSEU NEGATIVE 06/29/2020 1626   HGBUR NEGATIVE 06/29/2020 1626   BILIRUBINUR NEGATIVE 06/29/2020 1626   KETONESUR NEGATIVE 06/29/2020 1626   PROTEINUR NEGATIVE 06/29/2020 1626   UROBILINOGEN 1.0 02/13/2015 2016   NITRITE NEGATIVE 06/29/2020 1626   LEUKOCYTESUR NEGATIVE 06/29/2020 1626   Sepsis Labs Invalid input(s): PROCALCITONIN,  WBC,  LACTICIDVEN Microbiology Recent Results (from the past 240 hour(s))  SARS CORONAVIRUS 2 (TAT 6-24 HRS) Nasopharyngeal Nasopharyngeal Swab     Status: None   Collection Time: 06/29/20  4:25 PM   Specimen: Nasopharyngeal Swab  Result Value Ref Range Status   SARS Coronavirus 2 NEGATIVE NEGATIVE Final    Comment: (NOTE) SARS-CoV-2 target nucleic acids are NOT DETECTED.  The SARS-CoV-2 RNA is generally detectable in upper and lower respiratory specimens during the acute phase of infection. Negative results do not preclude SARS-CoV-2 infection, do not rule out co-infections with other pathogens, and should not be used as the sole basis for treatment or other patient management decisions. Negative results must be combined with clinical observations, patient history, and epidemiological information. The expected result is Negative.  Fact Sheet for Patients: SugarRoll.be  Fact Sheet for Healthcare Providers: https://www.woods-mathews.com/  This test is not yet approved or cleared by the [place] FDA and  has been authorized for detection and/or diagnosis of SARS-CoV-2 by FDA under an Emergency Use Authorization (EUA).  This EUA will remain  in effect (meaning this test can be used) for the  duration of the COVID-19 declaration under Se ction 564(b)(1) of the Act, 21 U.S.C. section 360bbb-3(b)(1), unless the authorization is terminated or revoked sooner.  Performed at [facility], [place] [address], [place], [place] [address]   Blood culture (routine x 2)     Status: None (Preliminary result)   Collection Time: 06/29/20 11:35 PM   Specimen: BLOOD LEFT HAND  Result Value Ref Range Status   Specimen Description BLOOD LEFT HAND  Final   Special Requests   Final    BOTTLES DRAWN AEROBIC ONLY Blood Culture results may not be optimal due to an inadequate volume of blood received in culture bottles   Culture   Final    NO GROWTH < 12 HOURS Performed at [facility], [place] [address] [place], [place] [address]    Report Status PENDING  Incomplete  Blood culture (routine x 2)     Status: None (Preliminary result)   Collection Time: 06/30/20 12:03 AM   Specimen: BLOOD  Result Value Ref Range Status   Specimen Description BLOOD RIGHT ANTECUBITAL  Final   Special Requests   Final    BOTTLES DRAWN AEROBIC ONLY Blood Culture results may not be optimal due to an inadequate volume of blood received in culture bottles   Culture   Final    NO GROWTH < 12 HOURS Performed at [facility], [place] [address], [place], [place] [address]    Report Status PENDING  Incomplete     Time coordinating discharge: Over 30 minutes  SIGNED:   Darliss Cheney, MD  [facility] 07/01/2020, 1:39 PM  If 7PM-7AM, please contact night-coverage [email or web address]

## 2020-07-05 LAB — CULTURE, BLOOD (ROUTINE X 2)
Culture: NO GROWTH
Culture: NO GROWTH

## 2020-07-06 LAB — CULTURE, BLOOD (ROUTINE X 2)
Culture: NO GROWTH
Culture: NO GROWTH
Special Requests: ADEQUATE
Special Requests: ADEQUATE

## 2020-07-18 DIAGNOSIS — G5601 Carpal tunnel syndrome, right upper limb: Secondary | ICD-10-CM | POA: Diagnosis not present

## 2020-07-19 ENCOUNTER — Telehealth: Payer: Self-pay | Admitting: Infectious Diseases

## 2020-07-19 NOTE — Telephone Encounter (Signed)
I spoke with Ms. Hickok about the MRI. She cannot afford the study at this time. Reports that she has not had a single other fever and back pain is back to baseline.   We talked about a plan to monitor temperatures and contact me for an inperson appointment should she have further concern for discitis features. Reviewed hospitalization with her on the phone and likely this is not infectious pathology on scans.   She is comfortable with this plan and will notify me of any changes.    Janene Madeira, MSN, NP-C [facility] [email or web address] Pager: [phone] Office: [phone] [place]: [phone]

## 2020-07-20 NOTE — Addendum Note (Signed)
Addended by: Vienna Callas on: 07/20/2020 10:39 AM   Modules accepted: Orders

## 2020-07-22 NOTE — Telephone Encounter (Signed)
I would recommend then we have her come in for an in office visit. Dr. Juleen China is also familiar with her so we can discuss further.  She declined repeat MRI due to cost.   Thank you for arranging.

## 2020-07-22 NOTE — Telephone Encounter (Signed)
Spoke with patient and she is scheduled with Dr. Juleen China on 07/25/20 Samantha [name] Samantha [name]

## 2020-07-22 NOTE — Telephone Encounter (Signed)
Patient called our office wanting to inform Samantha [name] that she is having back pain on the left side. Patient states she was advised to contact our office if she started having any type of new pain.  Patient states the back pain is worse than the back pain she was having previously. Patient rates pain 10/10 today and worse when she is walking. Patient can reached at [facility]

## 2020-07-25 ENCOUNTER — Ambulatory Visit (INDEPENDENT_AMBULATORY_CARE_PROVIDER_SITE_OTHER): Payer: Medicare Other | Admitting: Internal Medicine

## 2020-07-25 ENCOUNTER — Encounter: Payer: Self-pay | Admitting: Internal Medicine

## 2020-07-25 ENCOUNTER — Other Ambulatory Visit: Payer: Self-pay

## 2020-07-25 VITALS — BP 112/73 | HR 121 | Temp 99.1°F

## 2020-07-25 DIAGNOSIS — M545 Low back pain, unspecified: Secondary | ICD-10-CM

## 2020-07-25 DIAGNOSIS — E1169 Type 2 diabetes mellitus with other specified complication: Secondary | ICD-10-CM | POA: Diagnosis not present
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
## 2020-07-25 NOTE — Patient Instructions (Addendum)
Thank you for coming to see me today. It was a pleasure seeing you.  To Do: Marland Kitchen I will place a referral for you to see neurosurgery here at [facility] that Yoe had recommended . They can arrange follow up MRI when they get you scheduled and financially you are able to do so . You can follow up with [place] as needed for the time being  If you have any questions or concerns, please do not hesitate to call the office at [phone].  Take Care,   Jule Ser, DO

## 2020-07-31 DIAGNOSIS — R350 Frequency of micturition: Secondary | ICD-10-CM | POA: Diagnosis not present

## 2020-07-31 DIAGNOSIS — R300 Dysuria: Secondary | ICD-10-CM | POA: Diagnosis not present

## 2020-07-31 DIAGNOSIS — N3001 Acute cystitis with hematuria: Secondary | ICD-10-CM | POA: Diagnosis not present

## 2020-08-01 DIAGNOSIS — M79641 Pain in right hand: Secondary | ICD-10-CM | POA: Diagnosis not present

## 2020-08-10 DIAGNOSIS — G8929 Other chronic pain: Secondary | ICD-10-CM | POA: Diagnosis not present

## 2020-08-10 DIAGNOSIS — M4316 Spondylolisthesis, lumbar region: Secondary | ICD-10-CM | POA: Diagnosis not present

## 2020-08-10 DIAGNOSIS — M545 Low back pain, unspecified: Secondary | ICD-10-CM | POA: Diagnosis not present

## 2020-08-16 DIAGNOSIS — M1711 Unilateral primary osteoarthritis, right knee: Secondary | ICD-10-CM | POA: Diagnosis not present

## 2020-08-16 DIAGNOSIS — E1149 Type 2 diabetes mellitus with other diabetic neurological complication: Secondary | ICD-10-CM | POA: Diagnosis not present

## 2020-08-16 DIAGNOSIS — M47816 Spondylosis without myelopathy or radiculopathy, lumbar region: Secondary | ICD-10-CM | POA: Diagnosis not present

## 2020-08-16 DIAGNOSIS — I10 Essential (primary) hypertension: Secondary | ICD-10-CM | POA: Diagnosis not present

## 2020-08-16 DIAGNOSIS — E1169 Type 2 diabetes mellitus with other specified complication: Secondary | ICD-10-CM | POA: Diagnosis not present

## 2020-08-16 DIAGNOSIS — E7800 Pure hypercholesterolemia, unspecified: Secondary | ICD-10-CM | POA: Diagnosis not present

## 2020-08-16 DIAGNOSIS — G8929 Other chronic pain: Secondary | ICD-10-CM | POA: Diagnosis not present

## 2020-08-22 DIAGNOSIS — E1169 Type 2 diabetes mellitus with other specified complication: Secondary | ICD-10-CM | POA: Diagnosis not present

## 2020-08-24 DIAGNOSIS — J343 Hypertrophy of nasal turbinates: Secondary | ICD-10-CM | POA: Diagnosis not present

## 2020-08-24 DIAGNOSIS — J310 Chronic rhinitis: Secondary | ICD-10-CM | POA: Diagnosis not present

## 2020-09-07 DIAGNOSIS — M1711 Unilateral primary osteoarthritis, right knee: Secondary | ICD-10-CM | POA: Diagnosis not present

## 2020-09-07 DIAGNOSIS — M47816 Spondylosis without myelopathy or radiculopathy, lumbar region: Secondary | ICD-10-CM | POA: Diagnosis not present

## 2020-09-07 DIAGNOSIS — E1169 Type 2 diabetes mellitus with other specified complication: Secondary | ICD-10-CM | POA: Diagnosis not present

## 2020-09-07 DIAGNOSIS — E1149 Type 2 diabetes mellitus with other diabetic neurological complication: Secondary | ICD-10-CM | POA: Diagnosis not present

## 2020-09-07 DIAGNOSIS — G8929 Other chronic pain: Secondary | ICD-10-CM | POA: Diagnosis not present

## 2020-09-07 DIAGNOSIS — E7800 Pure hypercholesterolemia, unspecified: Secondary | ICD-10-CM | POA: Diagnosis not present

## 2020-09-07 DIAGNOSIS — I10 Essential (primary) hypertension: Secondary | ICD-10-CM | POA: Diagnosis not present

## 2020-09-13 DIAGNOSIS — R600 Localized edema: Secondary | ICD-10-CM | POA: Diagnosis not present

## 2020-09-13 DIAGNOSIS — I10 Essential (primary) hypertension: Secondary | ICD-10-CM | POA: Diagnosis not present

## 2020-09-13 DIAGNOSIS — E1169 Type 2 diabetes mellitus with other specified complication: Secondary | ICD-10-CM | POA: Diagnosis not present

## 2020-09-22 DIAGNOSIS — E1169 Type 2 diabetes mellitus with other specified complication: Secondary | ICD-10-CM | POA: Diagnosis not present

## 2020-10-03 DIAGNOSIS — Z79899 Other long term (current) drug therapy: Secondary | ICD-10-CM | POA: Diagnosis not present

## 2020-10-03 DIAGNOSIS — M545 Low back pain, unspecified: Secondary | ICD-10-CM | POA: Diagnosis not present

## 2020-10-21 DIAGNOSIS — E1169 Type 2 diabetes mellitus with other specified complication: Secondary | ICD-10-CM | POA: Diagnosis not present

## 2020-11-04 DIAGNOSIS — R21 Rash and other nonspecific skin eruption: Secondary | ICD-10-CM | POA: Diagnosis not present

## 2020-11-11 DIAGNOSIS — N390 Urinary tract infection, site not specified: Secondary | ICD-10-CM | POA: Diagnosis not present

## 2020-11-22 DIAGNOSIS — L200 Besnier's prurigo: Secondary | ICD-10-CM | POA: Diagnosis not present

## 2020-11-22 DIAGNOSIS — E1169 Type 2 diabetes mellitus with other specified complication: Secondary | ICD-10-CM | POA: Diagnosis not present

## 2020-12-04 ENCOUNTER — Observation Stay (HOSPITAL_BASED_OUTPATIENT_CLINIC_OR_DEPARTMENT_OTHER)
Admission: EM | Admit: 2020-12-04 | Discharge: 2020-12-06 | Disposition: A | Discharge status: Home / Self Care | Payer: Medicare Other | Attending: Internal Medicine | Admitting: Internal Medicine

## 2020-12-04 ENCOUNTER — Emergency Department (HOSPITAL_BASED_OUTPATIENT_CLINIC_OR_DEPARTMENT_OTHER): Payer: Medicare Other

## 2020-12-04 ENCOUNTER — Encounter (HOSPITAL_BASED_OUTPATIENT_CLINIC_OR_DEPARTMENT_OTHER): Payer: Self-pay | Admitting: Emergency Medicine

## 2020-12-04 ENCOUNTER — Other Ambulatory Visit: Payer: Self-pay

## 2020-12-04 DIAGNOSIS — Z87891 Personal history of nicotine dependence: Secondary | ICD-10-CM | POA: Insufficient documentation

## 2020-12-04 DIAGNOSIS — R400 Somnolence: Secondary | ICD-10-CM

## 2020-12-04 DIAGNOSIS — Z8673 Personal history of transient ischemic attack (TIA), and cerebral infarction without residual deficits: Secondary | ICD-10-CM | POA: Insufficient documentation

## 2020-12-04 DIAGNOSIS — E785 Hyperlipidemia, unspecified: Secondary | ICD-10-CM | POA: Diagnosis present

## 2020-12-04 DIAGNOSIS — Z79899 Other long term (current) drug therapy: Secondary | ICD-10-CM | POA: Insufficient documentation

## 2020-12-04 DIAGNOSIS — E1169 Type 2 diabetes mellitus with other specified complication: Secondary | ICD-10-CM | POA: Diagnosis present

## 2020-12-04 DIAGNOSIS — Z96652 Presence of left artificial knee joint: Secondary | ICD-10-CM | POA: Diagnosis not present

## 2020-12-04 DIAGNOSIS — H109 Unspecified conjunctivitis: Secondary | ICD-10-CM | POA: Diagnosis not present

## 2020-12-04 DIAGNOSIS — Z20822 Contact with and (suspected) exposure to covid-19: Secondary | ICD-10-CM | POA: Diagnosis not present

## 2020-12-04 DIAGNOSIS — Z7984 Long term (current) use of oral hypoglycemic drugs: Secondary | ICD-10-CM | POA: Insufficient documentation

## 2020-12-04 DIAGNOSIS — R000 Tachycardia, unspecified: Secondary | ICD-10-CM | POA: Diagnosis not present

## 2020-12-04 DIAGNOSIS — R55 Syncope and collapse: Secondary | ICD-10-CM | POA: Diagnosis not present

## 2020-12-04 DIAGNOSIS — Z7982 Long term (current) use of aspirin: Secondary | ICD-10-CM | POA: Diagnosis not present

## 2020-12-04 DIAGNOSIS — R059 Cough, unspecified: Secondary | ICD-10-CM | POA: Diagnosis not present

## 2020-12-04 DIAGNOSIS — I10 Essential (primary) hypertension: Secondary | ICD-10-CM | POA: Diagnosis not present

## 2020-12-04 DIAGNOSIS — D869 Sarcoidosis, unspecified: Secondary | ICD-10-CM | POA: Diagnosis present

## 2020-12-04 DIAGNOSIS — K8020 Calculus of gallbladder without cholecystitis without obstruction: Secondary | ICD-10-CM | POA: Diagnosis not present

## 2020-12-04 DIAGNOSIS — R0602 Shortness of breath: Secondary | ICD-10-CM | POA: Insufficient documentation

## 2020-12-04 DIAGNOSIS — E669 Obesity, unspecified: Secondary | ICD-10-CM | POA: Diagnosis present

## 2020-12-04 DIAGNOSIS — R0600 Dyspnea, unspecified: Secondary | ICD-10-CM | POA: Diagnosis not present

## 2020-12-04 DIAGNOSIS — E119 Type 2 diabetes mellitus without complications: Secondary | ICD-10-CM | POA: Diagnosis present

## 2020-12-04 LAB — CBC
HCT: 42.4 % (ref 36.0–46.0)
Hemoglobin: 13.7 g/dL (ref 12.0–15.0)
MCH: 28.4 pg (ref 26.0–34.0)
MCHC: 32.3 g/dL (ref 30.0–36.0)
MCV: 87.8 fL (ref 80.0–100.0)
Platelets: 261 10*3/uL (ref 150–400)
RBC: 4.83 MIL/uL (ref 3.87–5.11)
RDW: 13.3 % (ref 11.5–15.5)
WBC: 4.4 10*3/uL (ref 4.0–10.5)
nRBC: 0 % (ref 0.0–0.2)

## 2020-12-04 LAB — HEPATIC FUNCTION PANEL
ALT: 23 U/L (ref 0–44)
AST: 31 U/L (ref 15–41)
Albumin: 3.9 g/dL (ref 3.5–5.0)
Alkaline Phosphatase: 99 U/L (ref 38–126)
Bilirubin, Direct: 0.2 mg/dL (ref 0.0–0.2)
Indirect Bilirubin: 1 mg/dL — ABNORMAL HIGH (ref 0.3–0.9)
Total Bilirubin: 1.2 mg/dL (ref 0.3–1.2)
Total Protein: 8 g/dL (ref 6.5–8.1)

## 2020-12-04 LAB — MAGNESIUM: Magnesium: 1.6 mg/dL — ABNORMAL LOW (ref 1.7–2.4)

## 2020-12-04 LAB — BASIC METABOLIC PANEL
Anion gap: 9 (ref 5–15)
BUN: 12 mg/dL (ref 8–23)
CO2: 28 mmol/L (ref 22–32)
Calcium: 9.5 mg/dL (ref 8.9–10.3)
Chloride: 99 mmol/L (ref 98–111)
Creatinine, Ser: 0.58 mg/dL (ref 0.44–1.00)
GFR, Estimated: >60 mL/min (ref >60)
Glucose, Bld: 128 mg/dL — ABNORMAL HIGH (ref 70–99)
Potassium: 4.2 mmol/L (ref 3.5–5.1)
Sodium: 136 mmol/L (ref 135–145)

## 2020-12-04 LAB — RESP PANEL BY RT-PCR (FLU A&B, COVID) ARPGX2
Influenza A by PCR: NEGATIVE
Influenza B by PCR: NEGATIVE
SARS Coronavirus 2 by RT PCR: NEGATIVE

## 2020-12-04 LAB — URINALYSIS, ROUTINE W REFLEX MICROSCOPIC
Bilirubin Urine: NEGATIVE
Glucose, UA: NEGATIVE mg/dL
Hgb urine dipstick: NEGATIVE
Ketones, ur: NEGATIVE mg/dL
Leukocytes,Ua: NEGATIVE
Nitrite: NEGATIVE
Protein, ur: NEGATIVE mg/dL
Specific Gravity, Urine: 1.01 (ref 1.005–1.030)
pH: 7.5 (ref 5.0–8.0)

## 2020-12-04 LAB — TROPONIN I (HIGH SENSITIVITY): Troponin I (High Sensitivity): 2 ng/L (ref <18)

## 2020-12-04 LAB — GLUCOSE, CAPILLARY: Glucose-Capillary: 101 mg/dL — ABNORMAL HIGH (ref 70–99)

## 2020-12-04 LAB — D-DIMER, QUANTITATIVE: D-Dimer, Quant: 1.48 ug/mL-FEU — ABNORMAL HIGH (ref 0.00–0.50)

## 2020-12-04 LAB — LIPASE, BLOOD: Lipase: 25 U/L (ref 11–51)

## 2020-12-04 LAB — CBG MONITORING, ED: Glucose-Capillary: 148 mg/dL — ABNORMAL HIGH (ref 70–99)

## 2020-12-04 MED ORDER — MAGNESIUM SULFATE 2 GM/50ML IV SOLN
2.0000 g | Freq: Once | INTRAVENOUS | Status: AC
  Administered 2020-12-04: 2 g via INTRAVENOUS

## 2020-12-04 MED ORDER — OXYCODONE-ACETAMINOPHEN 7.5-325 MG PO TABS: 1.0000 | ORAL_TABLET | Freq: Four times a day (QID) | ORAL | Status: DC | PRN

## 2020-12-04 MED ORDER — ENOXAPARIN SODIUM 40 MG/0.4ML IJ SOSY
40.0000 mg | PREFILLED_SYRINGE | INTRAMUSCULAR | Status: DC
  Administered 2020-12-04: 40 mg via SUBCUTANEOUS
  Filled 2020-12-04: qty 0.4

## 2020-12-04 MED ORDER — ALBUTEROL SULFATE HFA 108 (90 BASE) MCG/ACT IN AERS
1.0000 | INHALATION_SPRAY | Freq: Four times a day (QID) | RESPIRATORY_TRACT | Status: DC | PRN
  Filled 2020-12-04: qty 6.7

## 2020-12-04 MED ORDER — OXYCODONE-ACETAMINOPHEN 5-325 MG PO TABS
1.0000 | ORAL_TABLET | Freq: Four times a day (QID) | ORAL | Status: DC | PRN
  Administered 2020-12-05 (×2): 1 via ORAL
  Filled 2020-12-04 (×2): qty 1

## 2020-12-04 MED ORDER — ROSUVASTATIN CALCIUM 20 MG PO TABS
20.0000 mg | ORAL_TABLET | Freq: Every day | ORAL | Status: DC
  Administered 2020-12-04 – 2020-12-05 (×2): 20 mg via ORAL
  Filled 2020-12-04 (×2): qty 1

## 2020-12-04 MED ORDER — INSULIN ASPART 100 UNIT/ML IJ SOLN
0.0000 [IU] | Freq: Three times a day (TID) | INTRAMUSCULAR | Status: DC
  Administered 2020-12-05: 2 [IU] via SUBCUTANEOUS
  Administered 2020-12-05: 1 [IU] via SUBCUTANEOUS
  Administered 2020-12-05: 2 [IU] via SUBCUTANEOUS

## 2020-12-04 MED ORDER — ACETAMINOPHEN 650 MG RE SUPP
650.0000 mg | Freq: Four times a day (QID) | RECTAL | Status: DC | PRN
Start: 2020-12-04 — End: 2020-12-06

## 2020-12-04 MED ORDER — ASPIRIN EC 81 MG PO TBEC
81.0000 mg | DELAYED_RELEASE_TABLET | Freq: Every day | ORAL | Status: DC
  Administered 2020-12-04 – 2020-12-05 (×2): 81 mg via ORAL
  Filled 2020-12-04 (×2): qty 1

## 2020-12-04 MED ORDER — SODIUM CHLORIDE 0.9% FLUSH
3.0000 mL | Freq: Two times a day (BID) | INTRAVENOUS | Status: DC
  Administered 2020-12-04 – 2020-12-06 (×3): 3 mL via INTRAVENOUS

## 2020-12-04 MED ORDER — ACETAMINOPHEN 325 MG PO TABS
650.0000 mg | ORAL_TABLET | Freq: Four times a day (QID) | ORAL | Status: DC | PRN
  Administered 2020-12-05: 650 mg via ORAL
  Filled 2020-12-04: qty 2

## 2020-12-04 MED ORDER — OXYCODONE HCL 5 MG PO TABS
2.5000 mg | ORAL_TABLET | Freq: Four times a day (QID) | ORAL | Status: DC | PRN
  Administered 2020-12-05 – 2020-12-06 (×2): 2.5 mg via ORAL
  Filled 2020-12-04 (×2): qty 1

## 2020-12-04 MED ORDER — IOHEXOL 350 MG/ML SOLN
100.0000 mL | Freq: Once | INTRAVENOUS | Status: AC
  Administered 2020-12-04: 89 mL via INTRAVENOUS

## 2020-12-04 MED ORDER — SODIUM CHLORIDE 0.9 % IV BOLUS
1000.0000 mL | Freq: Once | INTRAVENOUS | Status: AC
  Administered 2020-12-04: 1000 mL via INTRAVENOUS

## 2020-12-04 MED ORDER — ONDANSETRON HCL 4 MG/2ML IJ SOLN: 4.0000 mg | Freq: Four times a day (QID) | INTRAMUSCULAR | Status: DC | PRN

## 2020-12-04 MED ORDER — ONDANSETRON HCL 4 MG PO TABS: 4.0000 mg | ORAL_TABLET | Freq: Four times a day (QID) | ORAL | Status: DC | PRN

## 2020-12-04 MED ORDER — GUAIFENESIN ER 600 MG PO TB12: 600.0000 mg | ORAL_TABLET | Freq: Two times a day (BID) | ORAL | Status: DC | PRN

## 2020-12-04 MED ORDER — ENOXAPARIN SODIUM 60 MG/0.6ML IJ SOSY: 60.0000 mg | PREFILLED_SYRINGE | INTRAMUSCULAR | Status: DC
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
## 2020-12-04 NOTE — ED Notes (Signed)
Near syncope occurred at 0530 today
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
## 2020-12-04 NOTE — ED Notes (Signed)
This RN contact info provided for callback from [place] for report.

## 2020-12-05 ENCOUNTER — Observation Stay (HOSPITAL_COMMUNITY): Payer: Medicare Other

## 2020-12-05 DIAGNOSIS — D869 Sarcoidosis, unspecified: Secondary | ICD-10-CM

## 2020-12-05 DIAGNOSIS — E1169 Type 2 diabetes mellitus with other specified complication: Secondary | ICD-10-CM | POA: Diagnosis not present

## 2020-12-05 DIAGNOSIS — R55 Syncope and collapse: Secondary | ICD-10-CM

## 2020-12-05 DIAGNOSIS — I10 Essential (primary) hypertension: Secondary | ICD-10-CM | POA: Diagnosis not present

## 2020-12-05 DIAGNOSIS — E669 Obesity, unspecified: Secondary | ICD-10-CM

## 2020-12-05 DIAGNOSIS — E785 Hyperlipidemia, unspecified: Secondary | ICD-10-CM | POA: Diagnosis not present

## 2020-12-05 DIAGNOSIS — R400 Somnolence: Secondary | ICD-10-CM | POA: Diagnosis not present

## 2020-12-05 LAB — BASIC METABOLIC PANEL
Anion gap: 8 (ref 5–15)
BUN: 11 mg/dL (ref 8–23)
CO2: 26 mmol/L (ref 22–32)
Calcium: 9.4 mg/dL (ref 8.9–10.3)
Chloride: 103 mmol/L (ref 98–111)
Creatinine, Ser: 0.72 mg/dL (ref 0.44–1.00)
GFR, Estimated: >60 mL/min (ref >60)
Glucose, Bld: 139 mg/dL — ABNORMAL HIGH (ref 70–99)
Potassium: 3.7 mmol/L (ref 3.5–5.1)
Sodium: 137 mmol/L (ref 135–145)

## 2020-12-05 LAB — CBC
HCT: 39.8 % (ref 36.0–46.0)
Hemoglobin: 12.7 g/dL (ref 12.0–15.0)
MCH: 28.3 pg (ref 26.0–34.0)
MCHC: 31.9 g/dL (ref 30.0–36.0)
MCV: 88.6 fL (ref 80.0–100.0)
Platelets: 231 10*3/uL (ref 150–400)
RBC: 4.49 MIL/uL (ref 3.87–5.11)
RDW: 13.4 % (ref 11.5–15.5)
WBC: 4.2 10*3/uL (ref 4.0–10.5)
nRBC: 0 % (ref 0.0–0.2)

## 2020-12-05 LAB — TROPONIN I (HIGH SENSITIVITY): Troponin I (High Sensitivity): 4 ng/L (ref <18)

## 2020-12-05 LAB — GLUCOSE, CAPILLARY
Glucose-Capillary: 133 mg/dL — ABNORMAL HIGH (ref 70–99)
Glucose-Capillary: 155 mg/dL — ABNORMAL HIGH (ref 70–99)
Glucose-Capillary: 158 mg/dL — ABNORMAL HIGH (ref 70–99)
Glucose-Capillary: 98 mg/dL (ref 70–99)

## 2020-12-05 LAB — HEMOGLOBIN A1C
Hgb A1c MFr Bld: 7.3 % — ABNORMAL HIGH (ref 4.8–5.6)
Mean Plasma Glucose: 163 mg/dL

## 2020-12-05 LAB — MAGNESIUM: Magnesium: 1.9 mg/dL (ref 1.7–2.4)

## 2020-12-05 MED ORDER — ENOXAPARIN SODIUM 60 MG/0.6ML IJ SOSY
60.0000 mg | PREFILLED_SYRINGE | INTRAMUSCULAR | Status: DC
  Administered 2020-12-06: 60 mg via SUBCUTANEOUS
  Filled 2020-12-05: qty 0.6

## 2020-12-05 NOTE — Progress Notes (Addendum)
PROGRESS NOTE    Samantha [name]  MWU:[id number] DOB: 1951-03-02 DOA: 12/04/2020 PCP: Kathyrn Lass, MD    Brief Narrative:  Samantha [name] is a 70 year old female with past medical history significant for type 2 diabetes mellitus, essential hypertension, hyperlipidemia, sarcoidosis, TIA and chronic low back pain who initially presented to [place] for evaluation of several syncopal versus near syncopal episodes.  Patient reports around 05 30 on 12/04/2020 she was walking to the bathroom when she became lightheaded and felt like she was going to pass out.  Patient denies any vertiginous symptoms.  She was concerned that her blood sugar was low, on check was 93.  Patient denies any loss of consciousness or fall.  No associated chest pain, no palpitations, no nausea/vomiting.  Patient does endorse some recent shortness of breath at rest with chest congestion and nonproductive cough with some mild swelling in bilateral lower extremities.  Over the last few days, patient reports episodes of feeling drowsy while she was sitting down and reading.  Patient denies any recent changes in her home medications other than a recent course of Bactrim for UTI.  Denies any further urinary symptoms.  She does endorse that she sleeps at night and sometimes awakens from sleep.  Ambulates at baseline with the use of a cane.  In the ED, BP 127/83, HR 116, RR 18, temperature 99.7 F, SPO2 97% on room air.  Orthostatic vital signs were obtained which were significant for greater than 20 increase in heart rate from a lying to sitting position.  Sodium 136, potassium 4.2, magnesium 1.6, bicarb 28, BUN 12, creatinine 0.58, glucose 128.  AST 31, ALT 23, alkaline phosphatase 99, total bilirubin 1.2, lipase 25.  WBC 4.4, hemoglobin 13.7, platelets 261, D-dimer 1.48.  Urinalysis unrevealing.  SARS-CoV-2 PCR negative.  Influenza A/B PCR negative.  X-ray with chronic interstitial markings without acute cardiopulmonary  disease process.  CT head without contrast negative for acute intracranial abnormality, mild chronic microvascular ischemic changes noted.  CTA chest PE study negative for PE or acute intrathoracic process; unchanged partially calcified mediastinal and bilateral hilar lymph nodes consistent with sarcoidosis history of an unchanged mild lower lobe pulmonary fibrosis.  Patient was given 1 L normal saline in the hospital service consulted for further evaluation management of near syncopal episode.   Assessment & Plan:   Principal Problem:   Syncope Active Problems:   Sarcoidosis   HTN (hypertension)   HLD (hyperlipidemia)   Diabetes mellitus type 2 in obese [facility])   Near syncope Patient presenting to the ED with several recent near syncopal episodes associated with feeling drowsy and lightheadedness.  Patient was noted to have an elevated heart rate on orthostatic vital signs greater than 20 on admission and received 1 L normal saline.  Patient also reports feeling drowsy during the day while reading with apparent snoring at nighttime, concerning for possible obstructive sleep apnea.  With PT/OT with recommendation of home health PT.  No further dizziness currently. --Discontinued home lisinopril, could be contributing factor  --Pending TTE --Continue to monitor on telemetry --Would benefit from outpatient sleep study  Hypomagnesemia Magnesium 1.6 on admission, repleted.  History of TIA Continue home Crestor 20 mg p.o. daily and aspirin 81 mg p.o. daily  History of sarcoidosis Imaging studies show no significant change in regards to partially calcified mediastinal/hilar lymph nodes and lower lobe pulmonary fibrosis.  Oxygenating well on room air. --Albuterol/Mucinex as needed  Type 2 diabetes mellitus Hemoglobin A1c --Hold Amaryl --SSI for  coverage --CBGs qAC/HS  Essential hypertension Patient with noted orthostasis on admission. --Discontinued home lisinopril 2.5 mg p.o.  daily --Continue monitor BP  Chronic low back pain --Continue home Percocet  Morbid obesity Body mass index is 45.73 kg/m.  Discussed with patient needs for aggressive lifestyle changes/weight loss as this complicates all facets of care.  Outpatient follow-up with PCP.  May benefit from bariatric evaluation outpatient.  Also will refer to pulmonology for sleep study as possible contributing OSA/OHS.   DVT prophylaxis: Lovenox   Code Status: Full Code Family Communication: No family present at bedside this morning  Disposition Plan:  Level of care: Telemetry Status is: Observation  The patient remains OBS appropriate and will d/c before 2 midnights.  Dispo: The patient is from: Home              Anticipated d/c is to: Home              Patient currently is not medically stable to d/c.   Difficult to place patient No   Consultants:  None  Procedures:  TTE: Pending  Antimicrobials:  None   Subjective: Patient seen examined bedside, resting comfortably.  Working with PT/OT.  No recurrent symptoms this morning.  Currently holding lisinopril and received 1 L IV fluid bolus overnight.  Awaiting for TTE.  No other questions or concerns at this time.  Denies headache, no visual changes, no chest pain, no palpitations, no shortness of breath, no abdominal pain, no weakness, no fatigue, no paresthesias.  No acute events overnight per nursing staff.  Objective: Vitals:   12/05/20 0757 12/05/20 1109 12/05/20 1112 12/05/20 1114  BP: 133/73 117/74 109/67 133/81  Pulse: 100     Resp: 16     Temp: 97.9 F (36.6 C)     TempSrc: Oral     SpO2: 97% 93% 92% 94%  Weight:      Height:        Intake/Output Summary (Last 24 hours) at 12/05/2020 1336 Last data filed at 12/04/2020 2130 Gross per 24 hour  Intake 1221.42 ml  Output --  Net 1221.42 ml   Filed Weights   12/04/20 1109 12/05/20 0501  Weight: 121.1 kg 120.8 kg    Examination:  General exam: Appears calm and  comfortable  Respiratory system: Clear to auscultation. Respiratory effort normal.  On room air Cardiovascular system: S1 & S2 heard, RRR. No JVD, murmurs, rubs, gallops or clicks. No pedal edema. Gastrointestinal system: Abdomen is nondistended, soft and nontender. No organomegaly or masses felt. Normal bowel sounds heard. Central nervous system: Alert and oriented. No focal neurological deficits. Extremities: Symmetric 5 x 5 power. Skin: No rashes, lesions or ulcers Psychiatry: Judgement and insight appear normal. Mood & affect appropriate.     Data Reviewed: I have personally reviewed following labs and imaging studies  CBC: Recent Labs  Lab 12/04/20 1205 12/05/20 0550  WBC 4.4 4.2  HGB 13.7 12.7  HCT 42.4 39.8  MCV 87.8 88.6  PLT 261 301   Basic Metabolic Panel: Recent Labs  Lab 12/04/20 1320 12/05/20 0550  NA 136 137  K 4.2 3.7  CL 99 103  CO2 28 26  GLUCOSE 128* 139*  BUN 12 11  CREATININE 0.58 0.72  CALCIUM 9.5 9.4  MG 1.6* 1.9   GFR: Estimated Creatinine Clearance: 85 mL/min (by C-G formula based on SCr of 0.72 mg/dL). Liver Function Tests: Recent Labs  Lab 12/04/20 1320  AST 31  ALT 23  ALKPHOS 99  BILITOT 1.2  PROT 8.0  ALBUMIN 3.9   Recent Labs  Lab 12/04/20 1320  LIPASE 25   No results for input(s): AMMONIA in the last 168 hours. Coagulation Profile: No results for input(s): INR, PROTIME in the last 168 hours. Cardiac Enzymes: No results for input(s): CKTOTAL, CKMB, CKMBINDEX, TROPONINI in the last 168 hours. BNP (last 3 results) No results for input(s): PROBNP in the last 8760 hours. HbA1C: No results for input(s): HGBA1C in the last 72 hours. CBG: Recent Labs  Lab 12/04/20 1119 12/04/20 2105 12/05/20 0754 12/05/20 1136  GLUCAP 148* 101* 133* 155*   Lipid Profile: No results for input(s): CHOL, HDL, LDLCALC, TRIG, CHOLHDL, LDLDIRECT in the last 72 hours. Thyroid Function Tests: No results for input(s): TSH, T4TOTAL, FREET4,  T3FREE, THYROIDAB in the last 72 hours. Anemia Panel: No results for input(s): VITAMINB12, FOLATE, FERRITIN, TIBC, IRON, RETICCTPCT in the last 72 hours. Sepsis Labs: No results for input(s): PROCALCITON, LATICACIDVEN in the last 168 hours.  Recent Results (from the past 240 hour(s))  Resp Panel by RT-PCR (Flu A&B, Covid) Nasopharyngeal Swab     Status: None   Collection Time: 12/04/20 11:15 AM   Specimen: Nasopharyngeal Swab; Nasopharyngeal(NP) swabs in vial transport medium  Result Value Ref Range Status   SARS Coronavirus 2 by RT PCR NEGATIVE NEGATIVE Final    Comment: (NOTE) SARS-CoV-2 target nucleic acids are NOT DETECTED.  The SARS-CoV-2 RNA is generally detectable in upper respiratory specimens during the acute phase of infection. The lowest concentration of SARS-CoV-2 viral copies this assay can detect is 138 copies/mL. A negative result does not preclude SARS-Cov-2 infection and should not be used as the sole basis for treatment or other patient management decisions. A negative result may occur with  improper specimen collection/handling, submission of specimen other than nasopharyngeal swab, presence of viral mutation(s) within the areas targeted by this assay, and inadequate number of viral copies(<138 copies/mL). A negative result must be combined with clinical observations, patient history, and epidemiological information. The expected result is Negative.  Fact Sheet for Patients:  EntrepreneurPulse.com.au  Fact Sheet for Healthcare Providers:  IncredibleEmployment.be  This test is no t yet approved or cleared by the [place] FDA and  has been authorized for detection and/or diagnosis of SARS-CoV-2 by FDA under an Emergency Use Authorization (EUA). This EUA will remain  in effect (meaning this test can be used) for the duration of the COVID-19 declaration under Section 564(b)(1) of the Act, 21 U.S.C.section 360bbb-3(b)(1),  unless the authorization is terminated  or revoked sooner.       Influenza A by PCR NEGATIVE NEGATIVE Final   Influenza B by PCR NEGATIVE NEGATIVE Final    Comment: (NOTE) The Xpert Xpress SARS-CoV-2/FLU/RSV plus assay is intended as an aid in the diagnosis of influenza from Nasopharyngeal swab specimens and should not be used as a sole basis for treatment. Nasal washings and aspirates are unacceptable for Xpert Xpress SARS-CoV-2/FLU/RSV testing.  Fact Sheet for Patients: EntrepreneurPulse.com.au  Fact Sheet for Healthcare Providers: IncredibleEmployment.be  This test is not yet approved or cleared by the [place] FDA and has been authorized for detection and/or diagnosis of SARS-CoV-2 by FDA under an Emergency Use Authorization (EUA). This EUA will remain in effect (meaning this test can be used) for the duration of the COVID-19 declaration under Section 564(b)(1) of the Act, 21 U.S.C. section 360bbb-3(b)(1), unless the authorization is terminated or revoked.  Performed at [facility], [address] [place], [place] [address]          Radiology Studies: DG Chest 2 View  Result Date: 12/04/2020 CLINICAL DATA:  70 year old female with cough and dyspnea EXAM: CHEST - 2 VIEW COMPARISON:  11/15/2019 FINDINGS: Cardiomediastinal silhouette unchanged in size and contour. Low lung volumes persist with asymmetric elevation of the right hemidiaphragm. Coarsened interstitial markings of the lungs, similar to prior. No interlobular septal thickening. No pneumothorax or large pleural effusion. No new confluent airspace disease. No displaced fracture. IMPRESSION: Chronic lung changes without evidence of acute cardiopulmonary disease Electronically Signed   By: Corrie Mckusick D.O.   On: 12/04/2020 13:08   CT Head Wo Contrast  Result Date: 12/04/2020 CLINICAL DATA:  Near-syncope. EXAM: CT HEAD WITHOUT CONTRAST TECHNIQUE: Contiguous  axial images were obtained from the base of the skull through the vertex without intravenous contrast. COMPARISON:  CT head report dated Nov 04, 2009. FINDINGS: Brain: No evidence of acute infarction, hemorrhage, hydrocephalus, extra-axial collection or mass lesion/mass effect. Old lacunar white matter infarct adjacent to the left lateral ventricle frontal horn. Scattered mild periventricular and subcortical white matter hypodensities are nonspecific, but favored to reflect chronic microvascular ischemic changes. Vascular: Atherosclerotic vascular calcification of the carotid siphons. No hyperdense vessel. Skull: Normal. Negative for fracture or focal lesion. Sinuses/Orbits: No acute finding. Other: None. IMPRESSION: 1. No acute intracranial abnormality. 2. Mild chronic microvascular ischemic changes. Electronically Signed   By: Titus Dubin M.D.   On: 12/04/2020 13:20   CT Angio Chest PE W and/or Wo Contrast  Result Date: 12/04/2020 CLINICAL DATA:  Near-syncope and shortness of breath. Elevated D-dimer. History of sarcoidosis. EXAM: CT ANGIOGRAPHY CHEST WITH CONTRAST TECHNIQUE: Multidetector CT imaging of the chest was performed using the standard protocol during bolus administration of intravenous contrast. Multiplanar CT image reconstructions and MIPs were obtained to evaluate the vascular anatomy. CONTRAST:  24mL OMNIPAQUE IOHEXOL 350 MG/ML SOLN COMPARISON:  CTA chest dated January 31, 2019. FINDINGS: Cardiovascular: Satisfactory opacification of the pulmonary arteries to the segmental level. No evidence of pulmonary embolism. Normal heart size. No pericardial effusion. No thoracic aortic aneurysm or dissection. Coronary, aortic arch, and branch vessel atherosclerotic vascular disease. Mediastinum/Nodes: Partially calcified mediastinal and bilateral hilar lymph nodes are unchanged in size since 2020. For example a right paratracheal lymph node measures 1.5 cm in short axis, previously 1.5 cm. A right hilar  lymph node measures 2.2 cm in short axis, previously 2.2 cm. Unchanged 1.8 cm partially calcified nodule in the right thyroid lobe. Stability for greater than 5 years implies benignity; no biopsy or followup indicated.The trachea and esophagus demonstrate no significant findings. Lungs/Pleura: Chronic elevation of the right hemidiaphragm and right basilar scarring. Unchanged bibasilar subpleural cystic changes. No focal consolidation, pleural effusion, or pneumothorax. Upper Abdomen: No acute abnormality. Unchanged multiple small gallstones. Musculoskeletal: No chest wall abnormality. No acute or significant osseous findings. Review of the MIP images confirms the above findings. IMPRESSION: 1. No evidence of pulmonary embolism. No acute intrathoracic process. 2. Unchanged partially calcified mediastinal and bilateral hilar lymph nodes, consistent with history of sarcoidosis. 3. Unchanged mild lower lobe pulmonary fibrosis. 4. Unchanged cholelithiasis. 5. Aortic Atherosclerosis (ICD10-I70.0). Electronically Signed   By: Titus Dubin M.D.   On: 12/04/2020 16:59        Scheduled Meds:  aspirin EC  81 mg Oral QHS   enoxaparin (LOVENOX) injection  60 mg Subcutaneous Q24H   insulin aspart  0-9 Units Subcutaneous TID WC   rosuvastatin  20 mg Oral QHS  sodium chloride flush  3 mL Intravenous Q12H   Continuous Infusions:   LOS: 0 days    Time spent:38 minutes spent on chart review, discussion with nursing staff, consultants, updating family and interview/physical exam; more than 50% of that time was spent in counseling and/or coordination of care.    Kabrea Seeney J [place] DO [facility] Available via Epic secure chat 7am-7pm After these hours, please refer to coverage provider listed on [email or web address] 12/05/2020, 1:36 PM

## 2020-12-05 NOTE — Progress Notes (Signed)
Occupational Therapy Evaluation Patient Details Name: Samantha [name] MRN: [id number] DOB: 1951-05-31 Today's Date: 12/05/2020    History of Present Illness Samantha [name] is a 70 y.o. female who presented to [facility] ED 6/12 after several episodes of near syncope with associated loose stools and left-sided headaches. Pt with medical history significant for T2DM, HTN, HLD, sarcoidosis, TIA, and chronic low back pain who for evaluation of several syncopal/near syncopal episodes.   Clinical Impression   [name] was evaluated s/p the above impairments. PTA pt was living in a level entry apartment by her self, with friends near by who could provide assistance as needed; she was indep in all ADL/IADLs including driving. Upon evaluation pt was supervision level for all bed mobility with reports of back pain; reviewed log rolling for comfort. Pt completed functional mobility with quad cane given min guard for safety. No adverse affects, VSS on RA. Pt with initial reports of "just feeling weak," and noted she believes it is related to her depression from her mothers death two years ago. Pt required total A to don socks, however this is her baseline. Set up for upper body tasks, and min A for lower body. Pt reports she feels as if this is close to her baseline. Pt does not required further acute skilled OT services. Recommend d/c home with supervision.      Follow Up Recommendations  No OT follow up;Supervision - Intermittent    Equipment Recommendations  None recommended by OT       Precautions / Restrictions Precautions Precautions: Fall Precaution Comments: verbally reviewd back precautions and log rolling for comfort. Restrictions Weight Bearing Restrictions: No      Mobility Bed Mobility Overal bed mobility: Needs Assistance Bed Mobility: Rolling;Sidelying to Sit Rolling: Supervision Sidelying to sit: Supervision       General bed mobility comments: vd fro log rolling  for comfort due to back pain from prior injury    Transfers Overall transfer level: Needs assistance Equipment used: Quad cane Transfers: Sit to/from Stand Sit to Stand: Min guard;From elevated surface         General transfer comment: min guard for safety, pt reports all of her sitting surfaces at home are elevated    Balance Overall balance assessment: Needs assistance Sitting-balance support: No upper extremity supported Sitting balance-Leahy Scale: Poor     Standing balance support: Single extremity supported Standing balance-Leahy Scale: Fair               ADL either performed or assessed with clinical judgement   ADL Overall ADL's : Needs assistance/impaired Eating/Feeding: Independent;Sitting   Grooming: Wash/dry face;Wash/dry hands;Oral care;Applying deodorant;Brushing hair;Set up;Sitting   Upper Body Bathing: Minimal assistance;Standing   Lower Body Bathing: Minimal assistance;Sit to/from stand   Upper Body Dressing : Set up;Sitting   Lower Body Dressing: Moderate assistance;Sit to/from stand Lower Body Dressing Details (indicate cue type and reason): total A for socks, pt reports she has not been able to reach her feet for years Toilet Transfer: Min guard;Ambulation;Comfort height toilet;Grab bars (with quad cane)   Toileting- Clothing Manipulation and Hygiene: Min guard;Sit to/from stand       Functional mobility during ADLs: Min guard;Cane General ADL Comments: Pt is performing ADLs at her baseline per report; she does not reach her feet at home, wears slip on shoes, "slips" her pants on, and sits down while doing most of the task. She uses a tub bench at basline with hand held shower head  Vision Patient Visual Report: No change from baseline Vision Assessment?: No apparent visual deficits            Pertinent Vitals/Pain Pain Assessment: Faces Pain Score: 0-No pain Faces Pain Scale: Hurts a little bit Pain Location: back Pain  Descriptors / Indicators: Discomfort Pain Intervention(s): Limited activity within patient's tolerance;Monitored during session;Repositioned     Hand Dominance Right   Extremity/Trunk Assessment Upper Extremity Assessment Upper Extremity Assessment: Overall WFL for tasks assessed   Lower Extremity Assessment Lower Extremity Assessment: Defer to PT evaluation   Cervical / Trunk Assessment Cervical / Trunk Assessment: Normal;Other exceptions (increased body habitus)   Communication Communication Communication: No difficulties   Cognition Arousal/Alertness: Awake/alert Behavior During Therapy: WFL for tasks assessed/performed Overall Cognitive Status: Within Functional Limits for tasks assessed                                 General Comments: Pt reports depression due to her mothers death 2 years ago   General Comments  no new concerns noted, VSS on RA throughout session, educated pt on removing external cath and ambulating to the bathroom throughout the day with assistance.     Home Living Family/patient expects to be discharged to:: Private residence Living Arrangements: Alone Available Help at Discharge: Friend(s);Available PRN/intermittently Type of Home: Apartment Home Access: Level entry     Home Layout: One level     Bathroom Shower/Tub: Tub/shower unit;Curtain   Bathroom Toilet: Handicapped height (with toilet riser) Bathroom Accessibility: No   Home Equipment: Cane - quad;Walker - 4 wheels;Tub bench;Toilet riser;Hand held shower head   Additional Comments: Pt has several friends near by who can assist her as needed, however no support for 24/7      Prior Functioning/Environment Level of Independence: Independent with assistive device(s)        Comments: pt ambulates with cane        OT Problem List: Decreased activity tolerance;Impaired balance (sitting and/or standing);Decreased knowledge of precautions;Pain      OT  Treatment/Interventions:      OT Goals(Current goals can be found in the care plan section) Acute Rehab OT Goals Patient Stated Goal: to feel stronger OT Goal Formulation: With patient     Barriers to D/C:  Pt lives alone; limited support             AM-PAC OT "6 Clicks" Daily Activity     Outcome Measure Help from another person eating meals?: None Help from another person taking care of personal grooming?: None Help from another person toileting, which includes using toliet, bedpan, or urinal?: A Little Help from another person bathing (including washing, rinsing, drying)?: A Little Help from another person to put on and taking off regular upper body clothing?: A Little Help from another person to put on and taking off regular lower body clothing?: A Lot 6 Click Score: 19   End of Session Equipment Utilized During Treatment: Gait belt;Other (comment) (quad cane) Nurse Communication: Mobility status;Other (comment) (purwick status)  Activity Tolerance: Patient tolerated treatment well Patient left: in chair;with call bell/phone within reach;Other (comment) (PT hand off)  OT Visit Diagnosis: Muscle weakness (generalized) (M62.81);Pain                Time: 0802-0830 OT Time Calculation (min): 28 min Charges:  OT General Charges $OT Visit: 1 Visit OT Evaluation $OT Eval Moderate Complexity: 1 Mod OT Treatments $Therapeutic Activity: 8-22 mins  Aritzel Krusemark A Kolby Myung 12/05/2020, 8:50 AM

## 2020-12-05 NOTE — Care Management (Signed)
12-05-20 1523 Patient declined home health physical therapy at this time. Per patient, she wants to follow up with her primary care provider and then make a decision regarding home health physical therapy.

## 2020-12-05 NOTE — Evaluation (Signed)
Physical Therapy Evaluation Patient Details Name: Samantha [name] MRN: [id number] DOB: Dec 09, 1950 Today's Date: 12/05/2020   History of Present Illness  70 y.o. female presents to [place] ED on 12/04/2020 for evaluation of syncopal episodes, SOB, and LE swelling. Pt transferred to [facility] on 6/12. PMH includes anxiety, depression, DM, fibromyalgia, HLD, HTN, PE, sarcoidosis of lung, TIA.  Clinical Impression  Pt presents to PT with deficits in strength, power, balance, endurance, and with pain. Pt vital signs stable with mobility, denying symptoms of orthostatic BP. Pt is able to perform all mobility required in the home without physical assistance, however tolerance for ambulation is limited by back pain. Pt reports weakness compared to baseline and will benefit from continued acute PT services to improve gait quality and endurance. PT recommends HHPT at the time of discharge.    Follow Up Recommendations Home health PT;Supervision - Intermittent    Equipment Recommendations  None recommended by PT    Recommendations for Other Services       Precautions / Restrictions Precautions Precautions: Fall Precaution Comments: verbally reviewd back precautions and log rolling for comfort. Restrictions Weight Bearing Restrictions: No      Mobility  Bed Mobility Overal bed mobility: Needs Assistance Bed Mobility: Rolling;Sidelying to Sit Rolling: Supervision Sidelying to sit: Supervision       General bed mobility comments: not assessed, pt received and left sitting in recliner    Transfers Overall transfer level: Needs assistance Equipment used: Quad cane Transfers: Sit to/from Stand Sit to Stand: Supervision         General transfer comment: min guard for safety, pt reports all of her sitting surfaces at home are elevated  Ambulation/Gait Ambulation/Gait assistance: Supervision Gait Distance (Feet): 100 Feet Assistive device: Quad cane Gait  Pattern/deviations: Step-through pattern Gait velocity: reduced Gait velocity interpretation: 1.31 - 2.62 ft/sec, indicative of limited community ambulator General Gait Details: pt with slowed step-to gait, widened BOS. One brief standing rest break  Financial trader Rankin (Stroke Patients Only)       Balance Overall balance assessment: Needs assistance Sitting-balance support: No upper extremity supported;Feet supported Sitting balance-Leahy Scale: Good     Standing balance support: Single extremity supported Standing balance-Leahy Scale: Poor Standing balance comment: reliant on UE support of quad cane                             Pertinent Vitals/Pain Pain Assessment: 0-10 Pain Score: 8  Faces Pain Scale: Hurts a little bit Pain Location: low back Pain Descriptors / Indicators: Aching Pain Intervention(s): Patient requesting pain meds-RN notified    Home Living Family/patient expects to be discharged to:: Private residence Living Arrangements: Alone Available Help at Discharge: Neighbor;Available PRN/intermittently Type of Home: Apartment Home Access: Level entry     Home Layout: One level Home Equipment: Kasandra Knudsen - quad;Walker - 4 wheels;Tub bench;Toilet riser;Hand held shower head Additional Comments: Pt has several friends near by who can assist her as needed, however no support for 24/7    Prior Function Level of Independence: Independent with assistive device(s)         Comments: pt ambulates household distances with quad cane, utilizes electric scooters when in stores     Hand Dominance   Dominant Hand: Right    Extremity/Trunk Assessment   Upper Extremity Assessment Upper Extremity Assessment: Overall WFL for  tasks assessed    Lower Extremity Assessment Lower Extremity Assessment: Generalized weakness    Cervical / Trunk Assessment Cervical / Trunk Assessment: Normal  Communication    Communication: No difficulties  Cognition Arousal/Alertness: Awake/alert Behavior During Therapy: WFL for tasks assessed/performed Overall Cognitive Status: Within Functional Limits for tasks assessed                                 General Comments: Pt reports depression due to her mothers death 2 years ago      General Comments General comments (skin integrity, edema, etc.): pt tachy up to 120s with activity, BP stable 111/79 (89) in sitting and 126/79 in standing with pt denying symptoms    Exercises     Assessment/Plan    PT Assessment Patient needs continued PT services  PT Problem List Decreased strength;Decreased activity tolerance;Decreased balance;Decreased mobility;Pain       PT Treatment Interventions DME instruction;Gait training;Functional mobility training;Therapeutic activities;Therapeutic exercise;Balance training;Neuromuscular re-education;Patient/family education    PT Goals (Current goals can be found in the Care Plan section)  Acute Rehab PT Goals Patient Stated Goal: to go home and improve strength PT Goal Formulation: With patient Time For Goal Achievement: 12/19/20 Potential to Achieve Goals: Good Additional Goals Additional Goal #1: Pt will demonstrate the ability to maintain dynamic standing balance when reaching grater than 12 inches outside of her base of support independently.    Frequency Min 3X/week   Barriers to discharge        Co-evaluation               AM-PAC PT "6 Clicks" Mobility  Outcome Measure Help needed turning from your back to your side while in a flat bed without using bedrails?: A Little Help needed moving from lying on your back to sitting on the side of a flat bed without using bedrails?: A Little Help needed moving to and from a bed to a chair (including a wheelchair)?: A Little Help needed standing up from a chair using your arms (e.g., wheelchair or bedside chair)?: A Little Help needed to walk in  hospital room?: A Little Help needed climbing 3-5 steps with a railing? : A Little 6 Click Score: 18    End of Session   Activity Tolerance: Patient tolerated treatment well Patient left: in chair;with call bell/phone within reach Nurse Communication: Mobility status PT Visit Diagnosis: Other abnormalities of gait and mobility (R26.89);Muscle weakness (generalized) (M62.81)    Time: 1103-1594 PT Time Calculation (min) (ACUTE ONLY): 30 min   Charges:   PT Evaluation $PT Eval Low Complexity: [place], PT, DPT Acute Rehabilitation Pager: [phone]   Zenaida Niece 12/05/2020, 9:37 AM

## 2020-12-05 NOTE — Care Management Obs Status (Signed)
[facility] NOTIFICATION   Patient Details  Name: Samantha [name] MRN: [id number] Date of Birth: December 19, 1950   Medicare Observation Status Notification Given:  Yes    Bethena Roys, RN 12/05/2020, 6:01 PM

## 2020-12-06 ENCOUNTER — Observation Stay (HOSPITAL_BASED_OUTPATIENT_CLINIC_OR_DEPARTMENT_OTHER): Payer: Medicare Other

## 2020-12-06 DIAGNOSIS — E785 Hyperlipidemia, unspecified: Secondary | ICD-10-CM | POA: Diagnosis not present

## 2020-12-06 DIAGNOSIS — R55 Syncope and collapse: Secondary | ICD-10-CM | POA: Diagnosis not present

## 2020-12-06 DIAGNOSIS — R400 Somnolence: Secondary | ICD-10-CM | POA: Diagnosis not present

## 2020-12-06 DIAGNOSIS — I10 Essential (primary) hypertension: Secondary | ICD-10-CM | POA: Diagnosis not present

## 2020-12-06 DIAGNOSIS — E1169 Type 2 diabetes mellitus with other specified complication: Secondary | ICD-10-CM | POA: Diagnosis not present

## 2020-12-06 DIAGNOSIS — R531 Weakness: Secondary | ICD-10-CM | POA: Diagnosis not present

## 2020-12-06 LAB — ECHOCARDIOGRAM COMPLETE
AR max vel: 2.88 cm2
AV Area VTI: 3.1 cm2
AV Area mean vel: 2.65 cm2
AV Mean grad: 4 mmHg
AV Peak grad: 5.8 mmHg
Ao pk vel: 1.2 m/s
Area-P 1/2: 5.27 cm2
Height: 64 in
S' Lateral: 2.6 cm
Weight: 4276.8 oz

## 2020-12-06 LAB — GLUCOSE, CAPILLARY
Glucose-Capillary: 113 mg/dL — ABNORMAL HIGH (ref 70–99)
Glucose-Capillary: 96 mg/dL (ref 70–99)
Glucose-Capillary: 104 mg/dL — ABNORMAL HIGH (ref 70–99)

## 2020-12-06 MED ORDER — CIPROFLOXACIN HCL 0.3 % OP SOLN
2.0000 [drp] | OPHTHALMIC | Status: DC
  Filled 2020-12-06: qty 2.5

## 2020-12-06 MED ORDER — PERFLUTREN LIPID MICROSPHERE
1.0000 mL | INTRAVENOUS | Status: AC | PRN
Start: 2020-12-06 — End: 2020-12-06
  Administered 2020-12-06: 4 mL via INTRAVENOUS
  Filled 2020-12-06: qty 10

## 2020-12-06 MED ORDER — CIPROFLOXACIN HCL 0.3 % OP SOLN: 1.0000 [drp] | OPHTHALMIC | 0 refills | Status: AC

## 2020-12-06 MED ORDER — CIPROFLOXACIN HCL 0.3 % OP SOLN
2.0000 [drp] | OPHTHALMIC | Status: DC
  Administered 2020-12-06 (×3): 2 [drp] via OPHTHALMIC
  Filled 2020-12-06: qty 2.5

## 2020-12-06 NOTE — Progress Notes (Addendum)
Patient's O2 stats dropped into the low 60's while sleeping.  When patient wakes up her O2 stats go up above 90 percent. 2 liters of oxygen placed on patient.  Will continue to monitor.   Donah Driver, RN

## 2020-12-06 NOTE — Discharge Summary (Signed)
Physician Discharge Summary  [name] WUX:[id number] DOB: 1951/05/25 DOA: 12/04/2020  PCP: Kathyrn Lass, MD  Admit date: 12/04/2020 Discharge date: 12/06/2020  Admitted From: Home Disposition: Home  Recommendations for Outpatient Follow-up:  Follow up with PCP in 1-2 weeks Started on Cipro eyedrops for right eye conjunctivitis Discontinued home lisinopril due to borderline hypotension Ambulatory referral placed to neurology for outpatient sleep study, high suspicion for OSA causing her presenting symptoms with associated daytime somnolence, and chronic fatigue  Home Health: Declined home health PT Equipment/Devices: Oxygen, 2 L nocturnally  Discharge Condition: Stable CODE STATUS: Full code Diet recommendation: Heart healthy/consistent carbohydrate diet  History of present illness:  Samantha [name] is a 70 year old female with past medical history significant for type 2 diabetes mellitus, essential hypertension, hyperlipidemia, sarcoidosis, TIA and chronic low back pain who initially presented to [facility] for evaluation of several syncopal versus near syncopal episodes.  Patient reports around 05 30 on 12/04/2020 she was walking to the bathroom when she became lightheaded and felt like she was going to pass out.  Patient denies any vertiginous symptoms.  She was concerned that her blood sugar was low, on check was 93.  Patient denies any loss of consciousness or fall.  No associated chest pain, no palpitations, no nausea/vomiting.  Patient does endorse some recent shortness of breath at rest with chest congestion and nonproductive cough with some mild swelling in bilateral lower extremities.   Over the last few days, patient reports episodes of feeling drowsy while she was sitting down and reading.  Patient denies any recent changes in her home medications other than a recent course of Bactrim for UTI.  Denies any further urinary symptoms.  She does endorse that she sleeps  at night and sometimes awakens from sleep.  Ambulates at baseline with the use of a cane.   In the ED, BP 127/83, HR 116, RR 18, temperature 99.7 F, SPO2 97% on room air.  Orthostatic vital signs were obtained which were significant for greater than 20 increase in heart rate from a lying to sitting position.  Sodium 136, potassium 4.2, magnesium 1.6, bicarb 28, BUN 12, creatinine 0.58, glucose 128.  AST 31, ALT 23, alkaline phosphatase 99, total bilirubin 1.2, lipase 25.  WBC 4.4, hemoglobin 13.7, platelets 261, D-dimer 1.48.  Urinalysis unrevealing.  SARS-CoV-2 PCR negative.  Influenza A/B PCR negative.  X-ray with chronic interstitial markings without acute cardiopulmonary disease process.  CT head without contrast negative for acute intracranial abnormality, mild chronic microvascular ischemic changes noted.  CTA chest PE study negative for PE or acute intrathoracic process; unchanged partially calcified mediastinal and bilateral hilar lymph nodes consistent with sarcoidosis history of an unchanged mild lower lobe pulmonary fibrosis.  Patient was given 1 L normal saline in the hospital service consulted for further evaluation management of near syncopal episode.  Hospital course:  Near syncope Daytime somnolence Patient presenting to the ED with several recent near syncopal episodes associated with feeling drowsy and lightheadedness.  Patient was noted to have an elevated heart rate on orthostatic vital signs greater than 20 on admission and received 1 L normal saline.  Patient also reports feeling drowsy during the day while reading with apparent snoring at nighttime, concerning for possible obstructive sleep apnea.  With PT/OT with recommendation of home health PT.  No further dizziness currently.  Discontinue home lisinopril for borderline hypotension.  TTE with LVEF 25-36%, grade 1 diastolic dysfunction, mild LVH, no aortic stenosis.  While sleeping, patient was  noted to desaturate to 60% and O2 was  placed.  Amatory referral placed to neurology for outpatient sleep study, as suspicion for her symptoms are related to obstructive sleep apnea causing daytime somnolence, fatigue.  Will discharge on 2 L nasal cannula nocturnally until evaluated for CPAP.  Right eye conjunctivitis Patient reports drainage and matted eyelashes to right eye overnight.  Noted some mild subconjunctival erythema with drainage.  We will continue Cipro eyedrops x5 days.  Outpatient follow-up PCP.   Hypomagnesemia Repleted during hospitalization   History of TIA Continue home Crestor 20 mg p.o. daily and aspirin 81 mg p.o. daily   History of sarcoidosis Imaging studies show no significant change in regards to partially calcified mediastinal/hilar lymph nodes and lower lobe pulmonary fibrosis.  Oxygenating well on room air.   Type 2 diabetes mellitus Hemoglobin A1c 7.3.  Continue home Amaryl.     Essential hypertension Patient with noted orthostasis on admission.  Treated with IV fluid hydration, discontinued home lisinopril.  BP124/69 at time of discharge.  Outpatient follow-up with PCP.   Chronic low back pain Continue home Percocet   Morbid obesity Body mass index is 45.73 kg/m.  Discussed with patient needs for aggressive lifestyle changes/weight loss as this complicates all facets of care.  Outpatient follow-up with PCP.  May benefit from bariatric evaluation outpatient.  Also will refer to neurology for sleep study as possible contributing OSA/OHS.  Discharge Diagnoses:  Active Problems:   Sarcoidosis   HTN (hypertension)   HLD (hyperlipidemia)   Diabetes mellitus type 2 in obese (HCC)   Daytime somnolence    Discharge Instructions  Discharge Instructions     Ambulatory referral to Sleep Studies   Complete by: As directed    Call MD for:  difficulty breathing, headache or visual disturbances   Complete by: As directed    Call MD for:  extreme fatigue   Complete by: As directed    Call MD  for:  persistant dizziness or light-headedness   Complete by: As directed    Call MD for:  persistant nausea and vomiting   Complete by: As directed    Call MD for:  severe uncontrolled pain   Complete by: As directed    Call MD for:  temperature >100.4   Complete by: As directed    Diet - low sodium heart healthy   Complete by: As directed    Increase activity slowly   Complete by: As directed       Allergies as of 12/06/2020       Reactions   Other Other (See Comments)   NUCLEAR MED CONTRAST/DYE caused cardiac arrest, pulseless, no readings at all (12/04/2020 - pt states reaction was to radioactive tracers)   Amoxicillin-pot Clavulanate Other (See Comments)   Lethargy Did it involve swelling of the face/tongue/throat, SOB, or low BP? No Did it involve sudden or severe rash/hives, skin peeling, or any reaction on the inside of your mouth or nose? No Did you need to seek medical attention at a hospital or doctor's office? No When did it last happen?   unk    If all above answers are "NO", may proceed with cephalosporin use.   Cymbalta [duloxetine Hcl] Other (See Comments)   Pt says it causes her to be "looney" (AMS)   Metformin Hcl Diarrhea   Contrast Media [iodinated Diagnostic Agents] Other (See Comments)   Patient states no reaction to iv dye, MRI dye or other dyes-EXCEPT NUCLEAR MEDICINE CONTRAST/DYE  Medication List     STOP taking these medications    lisinopril 2.5 MG tablet Commonly known as: ZESTRIL       TAKE these medications    Accu-Chek Aviva Plus test strip Generic drug: glucose blood   Accu-Chek Softclix Lancets lancets   acetaminophen 500 MG tablet Commonly known as: TYLENOL Take 1,000 mg by mouth daily as needed for mild pain or headache.   aspirin 81 MG tablet Take 81 mg by mouth at bedtime.   B-6 PO Take 1 tablet by mouth daily with supper.   ciprofloxacin 0.3 % ophthalmic solution Commonly known as: CILOXAN Place 1 drop into  the right eye every 2 (two) hours for 5 days. Administer 1 drop, every 2 hours, while awake, for 2 days. Then 1 drop, every 4 hours, while awake, for the next 5 days.   cyclobenzaprine 10 MG tablet Commonly known as: FLEXERIL Take 10 mg by mouth at bedtime.   diclofenac Sodium 1 % Gel Commonly known as: VOLTAREN Apply 1 application topically 4 (four) times daily as needed (Joint pain).   FISH OIL TRIPLE STRENGTH PO Take 1,500 mg by mouth daily with supper.   fluticasone 50 MCG/ACT nasal spray Commonly known as: FLONASE Place 2 sprays into both nostrils daily as needed for allergies.   furosemide 40 MG tablet Commonly known as: LASIX Take 20 mg by mouth 2 (two) times a week. No set days   glimepiride 2 MG tablet Commonly known as: AMARYL Take 2 mg by mouth daily with breakfast.   loratadine 10 MG tablet Commonly known as: CLARITIN Take 10 mg by mouth at bedtime.   oxybutynin 5 MG tablet Commonly known as: DITROPAN Take 5 mg by mouth 3 (three) times daily.   oxyCODONE-acetaminophen 7.5-325 MG tablet Commonly known as: PERCOCET Take 1 tablet by mouth 2 (two) times daily as needed for moderate pain or severe pain.   PROBIOTIC PO Take 1 capsule by mouth daily with supper.   rosuvastatin 20 MG tablet Commonly known as: CRESTOR Take 20 mg by mouth at bedtime.   vitamin B-12 500 MCG tablet Commonly known as: CYANOCOBALAMIN Take 500 mcg by mouth daily with supper.   Vitamin D 50 MCG (2000 UT) tablet Take 2,000 Units by mouth daily with supper.               Durable Medical Equipment  (From admission, onward)           Start     Ordered   12/06/20 0726  For home use only DME oxygen  Once       Question Answer Comment  Length of Need Lifetime   Mode or (Route) Nasal cannula   Liters per Minute 2   Frequency Only at night (stationary unit needed)   Oxygen delivery system Gas      12/06/20 0725            Allergies  Allergen Reactions   Other  Other (See Comments)    NUCLEAR MED CONTRAST/DYE caused cardiac arrest, pulseless, no readings at all (12/04/2020 - pt states reaction was to radioactive tracers)   Amoxicillin-Pot Clavulanate Other (See Comments)    Lethargy Did it involve swelling of the face/tongue/throat, SOB, or low BP? No Did it involve sudden or severe rash/hives, skin peeling, or any reaction on the inside of your mouth or nose? No Did you need to seek medical attention at a hospital or doctor's office? No When did it last happen?  unk    If all above answers are "NO", may proceed with cephalosporin use.    Cymbalta [Duloxetine Hcl] Other (See Comments)    Pt says it causes her to be "looney" (AMS)   Metformin Hcl Diarrhea   Contrast Media [Iodinated Diagnostic Agents] Other (See Comments)    Patient states no reaction to iv dye, MRI dye or other dyes-EXCEPT NUCLEAR MEDICINE CONTRAST/DYE    Consultations: none   Procedures/Studies: DG Chest 2 View  Result Date: 12/04/2020 CLINICAL DATA:  70 year old female with cough and dyspnea EXAM: CHEST - 2 VIEW COMPARISON:  11/15/2019 FINDINGS: Cardiomediastinal silhouette unchanged in size and contour. Low lung volumes persist with asymmetric elevation of the right hemidiaphragm. Coarsened interstitial markings of the lungs, similar to prior. No interlobular septal thickening. No pneumothorax or large pleural effusion. No new confluent airspace disease. No displaced fracture. IMPRESSION: Chronic lung changes without evidence of acute cardiopulmonary disease Electronically Signed   By: Corrie Mckusick D.O.   On: 12/04/2020 13:08   CT Head Wo Contrast  Result Date: 12/04/2020 CLINICAL DATA:  Near-syncope. EXAM: CT HEAD WITHOUT CONTRAST TECHNIQUE: Contiguous axial images were obtained from the base of the skull through the vertex without intravenous contrast. COMPARISON:  CT head report dated Nov 04, 2009. FINDINGS: Brain: No evidence of acute infarction, hemorrhage,  hydrocephalus, extra-axial collection or mass lesion/mass effect. Old lacunar white matter infarct adjacent to the left lateral ventricle frontal horn. Scattered mild periventricular and subcortical white matter hypodensities are nonspecific, but favored to reflect chronic microvascular ischemic changes. Vascular: Atherosclerotic vascular calcification of the carotid siphons. No hyperdense vessel. Skull: Normal. Negative for fracture or focal lesion. Sinuses/Orbits: No acute finding. Other: None. IMPRESSION: 1. No acute intracranial abnormality. 2. Mild chronic microvascular ischemic changes. Electronically Signed   By: Titus Dubin M.D.   On: 12/04/2020 13:20   CT Angio Chest PE W and/or Wo Contrast  Result Date: 12/04/2020 CLINICAL DATA:  Near-syncope and shortness of breath. Elevated D-dimer. History of sarcoidosis. EXAM: CT ANGIOGRAPHY CHEST WITH CONTRAST TECHNIQUE: Multidetector CT imaging of the chest was performed using the standard protocol during bolus administration of intravenous contrast. Multiplanar CT image reconstructions and MIPs were obtained to evaluate the vascular anatomy. CONTRAST:  13mL OMNIPAQUE IOHEXOL 350 MG/ML SOLN COMPARISON:  CTA chest dated January 31, 2019. FINDINGS: Cardiovascular: Satisfactory opacification of the pulmonary arteries to the segmental level. No evidence of pulmonary embolism. Normal heart size. No pericardial effusion. No thoracic aortic aneurysm or dissection. Coronary, aortic arch, and branch vessel atherosclerotic vascular disease. Mediastinum/Nodes: Partially calcified mediastinal and bilateral hilar lymph nodes are unchanged in size since 2020. For example a right paratracheal lymph node measures 1.5 cm in short axis, previously 1.5 cm. A right hilar lymph node measures 2.2 cm in short axis, previously 2.2 cm. Unchanged 1.8 cm partially calcified nodule in the right thyroid lobe. Stability for greater than 5 years implies benignity; no biopsy or followup  indicated.The trachea and esophagus demonstrate no significant findings. Lungs/Pleura: Chronic elevation of the right hemidiaphragm and right basilar scarring. Unchanged bibasilar subpleural cystic changes. No focal consolidation, pleural effusion, or pneumothorax. Upper Abdomen: No acute abnormality. Unchanged multiple small gallstones. Musculoskeletal: No chest wall abnormality. No acute or significant osseous findings. Review of the MIP images confirms the above findings. IMPRESSION: 1. No evidence of pulmonary embolism. No acute intrathoracic process. 2. Unchanged partially calcified mediastinal and bilateral hilar lymph nodes, consistent with history of sarcoidosis. 3. Unchanged mild lower lobe pulmonary fibrosis. 4. Unchanged cholelithiasis.  5. Aortic Atherosclerosis (ICD10-I70.0). Electronically Signed   By: Titus Dubin M.D.   On: 12/04/2020 16:59   ECHOCARDIOGRAM COMPLETE  Result Date: 12/06/2020    ECHOCARDIOGRAM REPORT   Patient Name:   ANNI HOCEVAR Date of Exam: 12/06/2020 Medical Rec #:  [id number]        Height:       64.0 in Accession #:    [id number]       Weight:       267.3 lb Date of Birth:  12-08-1950        BSA:          2.213 m Patient Age:    62 years         BP:           131/65 mmHg Patient Gender: F                HR:           99 bpm. Exam Location:  Inpatient Procedure: 2D Echo, Cardiac Doppler and Color Doppler Indications:    Syncope  History:        Patient has prior history of Echocardiogram examinations, most                 recent 07/05/2014. Risk Factors:Former Smoker, Hypertension and                 Dyslipidemia.  Sonographer:    Cammy Brochure Referring Phys: [id number] Towner  1. Left ventricular ejection fraction, by estimation, is 60 to 65%. The left ventricle has normal function. The left ventricle has no regional wall motion abnormalities. There is mild left ventricular hypertrophy. Left ventricular diastolic parameters are consistent with Grade  I diastolic dysfunction (impaired relaxation).  2. Right ventricular systolic function is normal. The right ventricular size is normal.  3. The mitral valve is normal in structure. No evidence of mitral valve regurgitation. No evidence of mitral stenosis.  4. The aortic valve is tricuspid. Aortic valve regurgitation is not visualized. No aortic stenosis is present.  5. The inferior vena cava is dilated in size with >50% respiratory variability, suggesting right atrial pressure of 8 mmHg. FINDINGS  Left Ventricle: Left ventricular ejection fraction, by estimation, is 60 to 65%. The left ventricle has normal function. The left ventricle has no regional wall motion abnormalities. The left ventricular internal cavity size was normal in size. There is  mild left ventricular hypertrophy. Left ventricular diastolic parameters are consistent with Grade I diastolic dysfunction (impaired relaxation). Right Ventricle: The right ventricular size is normal. Right ventricular systolic function is normal. Left Atrium: Left atrial size was normal in size. Right Atrium: Right atrial size was normal in size. Pericardium: There is no evidence of pericardial effusion. Mitral Valve: The mitral valve is normal in structure. No evidence of mitral valve regurgitation. No evidence of mitral valve stenosis. Tricuspid Valve: The tricuspid valve is normal in structure. Tricuspid valve regurgitation is trivial. No evidence of tricuspid stenosis. Aortic Valve: The aortic valve is tricuspid. Aortic valve regurgitation is not visualized. No aortic stenosis is present. Aortic valve mean gradient measures 4.0 mmHg. Aortic valve peak gradient measures 5.8 mmHg. Aortic valve area, by VTI measures 3.10 cm. Pulmonic Valve: The pulmonic valve was normal in structure. Pulmonic valve regurgitation is not visualized. No evidence of pulmonic stenosis. Aorta: The aortic root is normal in size and structure. Venous: The inferior vena cava is dilated in size  with greater than 50%  respiratory variability, suggesting right atrial pressure of 8 mmHg. IAS/Shunts: No atrial level shunt detected by color flow Doppler.  LEFT VENTRICLE PLAX 2D LVIDd:         3.80 cm  Diastology LVIDs:         2.60 cm  LV e' medial:    7.94 cm/s LV PW:         1.30 cm  LV E/e' medial:  7.0 LV IVS:        1.30 cm  LV e' lateral:   9.36 cm/s LVOT diam:     2.00 cm  LV E/e' lateral: 5.9 LV SV:         65 LV SV Index:   29 LVOT Area:     3.14 cm  RIGHT VENTRICLE             IVC RV Basal diam:  3.30 cm     IVC diam: 2.20 cm RV S prime:     13.50 cm/s TAPSE (M-mode): 2.6 cm LEFT ATRIUM             Index       RIGHT ATRIUM          Index LA diam:        2.90 cm 1.31 cm/m  RA Area:     7.47 cm LA Vol (A2C):   43.3 ml 19.57 ml/m RA Volume:   12.10 ml 5.47 ml/m LA Vol (A4C):   33.6 ml 15.18 ml/m LA Biplane Vol: 40.5 ml 18.30 ml/m  AORTIC VALVE AV Area (Vmax):    2.88 cm AV Area (Vmean):   2.65 cm AV Area (VTI):     3.10 cm AV Vmax:           120.00 cm/s AV Vmean:          88.700 cm/s AV VTI:            0.210 m AV Peak Grad:      5.8 mmHg AV Mean Grad:      4.0 mmHg LVOT Vmax:         110.00 cm/s LVOT Vmean:        74.900 cm/s LVOT VTI:          0.207 m LVOT/AV VTI ratio: 0.99  AORTA Ao Root diam: 2.90 cm Ao Asc diam:  3.30 cm MITRAL VALVE MV Area (PHT): 5.27 cm     SHUNTS MV Decel Time: 144 msec     Systemic VTI:  0.21 m MV E velocity: 55.20 cm/s   Systemic Diam: 2.00 cm MV A velocity: 108.00 cm/s MV E/A ratio:  0.51 Kirk Ruths MD Electronically signed by Kirk Ruths MD Signature Date/Time: 12/06/2020/12:13:47 PM    Final      Subjective: Patient seen examined bedside, resting comfortably.  No complaints this morning.  Discussed with patient regarding etiology of her symptoms, high suspicion for obstructive sleep apnea given her daytime somnolence, chronic fatigue and noted desaturation to low 60s while sleeping overnight.  Will refer patient for sleep study, likely needs CPAP.  In  the interim, discussed with patient as bridge obtain nocturnal oxygen for her to use at home.  No other questions or concerns at this time.  Currently denies headache, no visual changes, no chest pain, no palpitations, no shortness of breath, no abdominal pain, no cough/congestion, no fever/chills/night sweats, no nausea/vomiting/diarrhea.  No acute events overnight per nursing staff.  Discharge Exam: Vitals:   12/06/20 1000 12/06/20 1309  BP:  117/81 124/69  Pulse: 89 93  Resp: 16 18  Temp: 99 F (37.2 C) 98.9 F (37.2 C)  SpO2: 100% 96%   Vitals:   12/05/20 2330 12/06/20 0508 12/06/20 1000 12/06/20 1309  BP: 136/73 131/65 117/81 124/69  Pulse: (!) 101 99 89 93  Resp: 16 15 16 18   Temp: 97.9 F (36.6 C) 97.9 F (36.6 C) 99 F (37.2 C) 98.9 F (37.2 C)  TempSrc: Oral Oral Oral Oral  SpO2: 90% 100% 100% 96%  Weight:  121.2 kg    Height:        General: Pt is alert, awake, not in acute distress Cardiovascular: RRR, S1/S2 +, no rubs, no gallops Respiratory: CTA bilaterally, no wheezing, no rhonchi, on room air at rest, but desaturated to 60% while sleeping last night Abdominal: Soft, NT, ND, bowel sounds + Extremities: no edema, no cyanosis    The results of significant diagnostics from this hospitalization (including imaging, microbiology, ancillary and laboratory) are listed below for reference.     Microbiology: Recent Results (from the past 240 hour(s))  Resp Panel by RT-PCR (Flu A&B, Covid) Nasopharyngeal Swab     Status: None   Collection Time: 12/04/20 11:15 AM   Specimen: Nasopharyngeal Swab; Nasopharyngeal(NP) swabs in vial transport medium  Result Value Ref Range Status   SARS Coronavirus 2 by RT PCR NEGATIVE NEGATIVE Final    Comment: (NOTE) SARS-CoV-2 target nucleic acids are NOT DETECTED.  The SARS-CoV-2 RNA is generally detectable in upper respiratory specimens during the acute phase of infection. The lowest concentration of SARS-CoV-2 viral copies this  assay can detect is 138 copies/mL. A negative result does not preclude SARS-Cov-2 infection and should not be used as the sole basis for treatment or other patient management decisions. A negative result may occur with  improper specimen collection/handling, submission of specimen other than nasopharyngeal swab, presence of viral mutation(s) within the areas targeted by this assay, and inadequate number of viral copies(<138 copies/mL). A negative result must be combined with clinical observations, patient history, and epidemiological information. The expected result is Negative.  Fact Sheet for Patients:  EntrepreneurPulse.com.au  Fact Sheet for Healthcare Providers:  IncredibleEmployment.be  This test is no t yet approved or cleared by the [place] FDA and  has been authorized for detection and/or diagnosis of SARS-CoV-2 by FDA under an Emergency Use Authorization (EUA). This EUA will remain  in effect (meaning this test can be used) for the duration of the COVID-19 declaration under Section 564(b)(1) of the Act, 21 U.S.C.section 360bbb-3(b)(1), unless the authorization is terminated  or revoked sooner.       Influenza A by PCR NEGATIVE NEGATIVE Final   Influenza B by PCR NEGATIVE NEGATIVE Final    Comment: (NOTE) The Xpert Xpress SARS-CoV-2/FLU/RSV plus assay is intended as an aid in the diagnosis of influenza from Nasopharyngeal swab specimens and should not be used as a sole basis for treatment. Nasal washings and aspirates are unacceptable for Xpert Xpress SARS-CoV-2/FLU/RSV testing.  Fact Sheet for Patients: EntrepreneurPulse.com.au  Fact Sheet for Healthcare Providers: IncredibleEmployment.be  This test is not yet approved or cleared by the [place] FDA and has been authorized for detection and/or diagnosis of SARS-CoV-2 by FDA under an Emergency Use Authorization (EUA). This EUA will  remain in effect (meaning this test can be used) for the duration of the COVID-19 declaration under Section 564(b)(1) of the Act, 21 U.S.C. section 360bbb-3(b)(1), unless the authorization is terminated or revoked.  Performed at [facility], [address] [place], [place] [address]      Labs: BNP (last 3 results) No results for input(s): BNP in the last 8760 hours. Basic Metabolic Panel: Recent Labs  Lab 12/04/20 1320 12/05/20 0550  NA 136 137  K 4.2 3.7  CL 99 103  CO2 28 26  GLUCOSE 128* 139*  BUN 12 11  CREATININE 0.58 0.72  CALCIUM 9.5 9.4  MG 1.6* 1.9   Liver Function Tests: Recent Labs  Lab 12/04/20 1320  AST 31  ALT 23  ALKPHOS 99  BILITOT 1.2  PROT 8.0  ALBUMIN 3.9   Recent Labs  Lab 12/04/20 1320  LIPASE 25   No results for input(s): AMMONIA in the last 168 hours. CBC: Recent Labs  Lab 12/04/20 1205 12/05/20 0550  WBC 4.4 4.2  HGB 13.7 12.7  HCT 42.4 39.8  MCV 87.8 88.6  PLT 261 231   Cardiac Enzymes: No results for input(s): CKTOTAL, CKMB, CKMBINDEX, TROPONINI in the last 168 hours. BNP: Invalid input(s): POCBNP CBG: Recent Labs  Lab 12/05/20 1136 12/05/20 1612 12/05/20 2114 12/06/20 0810 12/06/20 1152  GLUCAP 155* 158* 98 113* 96   D-Dimer Recent Labs    12/04/20 1515  DDIMER 1.48*   Hgb A1c Recent Labs    12/05/20 0550  HGBA1C 7.3*   Lipid Profile No results for input(s): CHOL, HDL, LDLCALC, TRIG, CHOLHDL, LDLDIRECT in the last 72 hours. Thyroid function studies No results for input(s): TSH, T4TOTAL, T3FREE, THYROIDAB in the last 72 hours.  Invalid input(s): FREET3 Anemia work up No results for input(s): VITAMINB12, FOLATE, FERRITIN, TIBC, IRON, RETICCTPCT in the last 72 hours. Urinalysis    Component Value Date/Time   COLORURINE STRAW (A) 12/04/2020 1133   APPEARANCEUR CLEAR 12/04/2020 1133   LABSPEC 1.010 12/04/2020 1133   PHURINE 7.5 12/04/2020 1133   GLUCOSEU NEGATIVE 12/04/2020 1133    HGBUR NEGATIVE 12/04/2020 1133   BILIRUBINUR NEGATIVE 12/04/2020 1133   KETONESUR NEGATIVE 12/04/2020 1133   PROTEINUR NEGATIVE 12/04/2020 1133   UROBILINOGEN 1.0 02/13/2015 2016   NITRITE NEGATIVE 12/04/2020 1133   LEUKOCYTESUR NEGATIVE 12/04/2020 1133   Sepsis Labs Invalid input(s): PROCALCITONIN,  WBC,  LACTICIDVEN Microbiology Recent Results (from the past 240 hour(s))  Resp Panel by RT-PCR (Flu A&B, Covid) Nasopharyngeal Swab     Status: None   Collection Time: 12/04/20 11:15 AM   Specimen: Nasopharyngeal Swab; Nasopharyngeal(NP) swabs in vial transport medium  Result Value Ref Range Status   SARS Coronavirus 2 by RT PCR NEGATIVE NEGATIVE Final    Comment: (NOTE) SARS-CoV-2 target nucleic acids are NOT DETECTED.  The SARS-CoV-2 RNA is generally detectable in upper respiratory specimens during the acute phase of infection. The lowest concentration of SARS-CoV-2 viral copies this assay can detect is 138 copies/mL. A negative result does not preclude SARS-Cov-2 infection and should not be used as the sole basis for treatment or other patient management decisions. A negative result may occur with  improper specimen collection/handling, submission of specimen other than nasopharyngeal swab, presence of viral mutation(s) within the areas targeted by this assay, and inadequate number of viral copies(<138 copies/mL). A negative result must be combined with clinical observations, patient history, and epidemiological information. The expected result is Negative.  Fact Sheet for Patients:  EntrepreneurPulse.com.au  Fact Sheet for Healthcare Providers:  IncredibleEmployment.be  This test is no t yet approved or cleared by the [place] FDA and  has been authorized for detection and/or diagnosis of SARS-CoV-2 by  FDA under an Emergency Use Authorization (EUA). This EUA will remain  in effect (meaning this test can be used) for the duration of  the COVID-19 declaration under Section 564(b)(1) of the Act, 21 U.S.C.section 360bbb-3(b)(1), unless the authorization is terminated  or revoked sooner.       Influenza A by PCR NEGATIVE NEGATIVE Final   Influenza B by PCR NEGATIVE NEGATIVE Final    Comment: (NOTE) The Xpert Xpress SARS-CoV-2/FLU/RSV plus assay is intended as an aid in the diagnosis of influenza from Nasopharyngeal swab specimens and should not be used as a sole basis for treatment. Nasal washings and aspirates are unacceptable for Xpert Xpress SARS-CoV-2/FLU/RSV testing.  Fact Sheet for Patients: EntrepreneurPulse.com.au  Fact Sheet for Healthcare Providers: IncredibleEmployment.be  This test is not yet approved or cleared by the [place] FDA and has been authorized for detection and/or diagnosis of SARS-CoV-2 by FDA under an Emergency Use Authorization (EUA). This EUA will remain in effect (meaning this test can be used) for the duration of the COVID-19 declaration under Section 564(b)(1) of the Act, 21 U.S.C. section 360bbb-3(b)(1), unless the authorization is terminated or revoked.  Performed at [facility], [address] [place], [place] [address]      Time coordinating discharge: Over 30 minutes  SIGNED:   Donnamarie Poag [place] DO  [facility] 12/06/2020, 1:20 PM

## 2020-12-06 NOTE — TOC Transition Note (Signed)
Transition of [name] [facility]) - CM/SW Discharge Note   Patient Details  Name: Samantha [name] MRN: [id number] Date of Birth: 12-26-1950  Transition of [name]) CM/SW Contact:  Zenon Mayo, RN Phone Number: 12/06/2020, 1:42 PM   Clinical Narrative:    Patient is for dc today, she will need home oxygen. She states she is ok with [employer] supplying the oxygen.  NCM made referral to [facility] with [facility]. This will be brought up to her room prior to dc.   Final next level of [name]: Home/Self [name] Barriers to Discharge: No Barriers Identified   Patient Goals and CMS Choice Patient states their goals for this hospitalization and ongoing recovery are:: return home   Choice offered to / list presented to : NA  Discharge Placement                       Discharge Plan and Services                DME Arranged: Oxygen, [place] DME Agency: [employer] Date DME Agency Contacted: 12/06/20 Time DME Agency Contacted: [phone] Representative spoke with at DME Agency: [place]: Refused HH          Social Determinants of Health ([place]) Interventions     Readmission Risk Interventions No flowsheet data found.

## 2020-12-09 DIAGNOSIS — R55 Syncope and collapse: Secondary | ICD-10-CM | POA: Diagnosis not present

## 2020-12-09 DIAGNOSIS — R0902 Hypoxemia: Secondary | ICD-10-CM | POA: Diagnosis not present

## 2020-12-21 DIAGNOSIS — E1169 Type 2 diabetes mellitus with other specified complication: Secondary | ICD-10-CM | POA: Diagnosis not present

## 2021-01-01 ENCOUNTER — Emergency Department (HOSPITAL_BASED_OUTPATIENT_CLINIC_OR_DEPARTMENT_OTHER): Payer: Medicare Other

## 2021-01-01 ENCOUNTER — Other Ambulatory Visit: Payer: Self-pay

## 2021-01-01 ENCOUNTER — Emergency Department (HOSPITAL_BASED_OUTPATIENT_CLINIC_OR_DEPARTMENT_OTHER)
Admission: EM | Admit: 2021-01-01 | Discharge: 2021-01-01 | Disposition: A | Discharge status: Home / Self Care | Payer: Medicare Other | Attending: Emergency Medicine | Admitting: Emergency Medicine

## 2021-01-01 ENCOUNTER — Encounter (HOSPITAL_BASED_OUTPATIENT_CLINIC_OR_DEPARTMENT_OTHER): Payer: Self-pay

## 2021-01-01 DIAGNOSIS — Z7982 Long term (current) use of aspirin: Secondary | ICD-10-CM | POA: Diagnosis not present

## 2021-01-01 DIAGNOSIS — R000 Tachycardia, unspecified: Secondary | ICD-10-CM | POA: Insufficient documentation

## 2021-01-01 DIAGNOSIS — Z87891 Personal history of nicotine dependence: Secondary | ICD-10-CM | POA: Insufficient documentation

## 2021-01-01 DIAGNOSIS — Z7984 Long term (current) use of oral hypoglycemic drugs: Secondary | ICD-10-CM | POA: Diagnosis not present

## 2021-01-01 DIAGNOSIS — I10 Essential (primary) hypertension: Secondary | ICD-10-CM | POA: Insufficient documentation

## 2021-01-01 DIAGNOSIS — E119 Type 2 diabetes mellitus without complications: Secondary | ICD-10-CM | POA: Insufficient documentation

## 2021-01-01 DIAGNOSIS — R0602 Shortness of breath: Secondary | ICD-10-CM | POA: Insufficient documentation

## 2021-01-01 DIAGNOSIS — Z79899 Other long term (current) drug therapy: Secondary | ICD-10-CM | POA: Insufficient documentation

## 2021-01-01 DIAGNOSIS — R2243 Localized swelling, mass and lump, lower limb, bilateral: Secondary | ICD-10-CM | POA: Insufficient documentation

## 2021-01-01 DIAGNOSIS — R600 Localized edema: Secondary | ICD-10-CM

## 2021-01-01 DIAGNOSIS — Z96652 Presence of left artificial knee joint: Secondary | ICD-10-CM | POA: Insufficient documentation

## 2021-01-01 DIAGNOSIS — M7989 Other specified soft tissue disorders: Secondary | ICD-10-CM | POA: Diagnosis not present

## 2021-01-01 LAB — BASIC METABOLIC PANEL
Anion gap: 9 (ref 5–15)
BUN: 14 mg/dL (ref 8–23)
CO2: 28 mmol/L (ref 22–32)
Calcium: 9.5 mg/dL (ref 8.9–10.3)
Chloride: 99 mmol/L (ref 98–111)
Creatinine, Ser: 0.62 mg/dL (ref 0.44–1.00)
GFR, Estimated: >60 mL/min (ref >60)
Glucose, Bld: 127 mg/dL — ABNORMAL HIGH (ref 70–99)
Potassium: 4 mmol/L (ref 3.5–5.1)
Sodium: 136 mmol/L (ref 135–145)

## 2021-01-01 LAB — CBC WITH DIFFERENTIAL/PLATELET
Abs Immature Granulocytes: 0.01 10*3/uL (ref 0.00–0.07)
Basophils Absolute: 0 10*3/uL (ref 0.0–0.1)
Basophils Relative: 1 %
Eosinophils Absolute: 0.1 10*3/uL (ref 0.0–0.5)
Eosinophils Relative: 2 %
HCT: 42.5 % (ref 36.0–46.0)
Hemoglobin: 13.7 g/dL (ref 12.0–15.0)
Immature Granulocytes: 0 %
Lymphocytes Relative: 27 %
Lymphs Abs: 1 10*3/uL (ref 0.7–4.0)
MCH: 28.6 pg (ref 26.0–34.0)
MCHC: 32.2 g/dL (ref 30.0–36.0)
MCV: 88.7 fL (ref 80.0–100.0)
Monocytes Absolute: 0.4 10*3/uL (ref 0.1–1.0)
Monocytes Relative: 11 %
Neutro Abs: 2.2 10*3/uL (ref 1.7–7.7)
Neutrophils Relative %: 59 %
Platelets: 203 10*3/uL (ref 150–400)
RBC: 4.79 MIL/uL (ref 3.87–5.11)
RDW: 13.2 % (ref 11.5–15.5)
WBC: 3.8 10*3/uL — ABNORMAL LOW (ref 4.0–10.5)
nRBC: 0 % (ref 0.0–0.2)

## 2021-01-01 LAB — BRAIN NATRIURETIC PEPTIDE: B Natriuretic Peptide: 9 pg/mL (ref 0.0–100.0)

## 2021-01-01 LAB — TROPONIN I (HIGH SENSITIVITY): Troponin I (High Sensitivity): 2 ng/L (ref <18)

## 2021-01-01 NOTE — ED Triage Notes (Signed)
Pt c/o bilateral ankle, feet, knee and left thigh swelling. States gained 2lbs on Saturday, took lasix. C/o dyspnea on exertion.
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
## 2021-01-01 NOTE — Discharge Instructions (Addendum)
It was a pleasure taking care of you today. As discussed, all of your labs were reassuring today. Your ultrasound was negative for blood clots. Your chest x-ray did not show any fluid in your lungs. Continue to take your lasix as prescribed by your PCP. Follow-up with PCP early this week for further evaluation. Return to the ER for new or worsening symptoms.

## 2021-01-01 NOTE — ED Notes (Signed)
Attempted IV/labs x 1

## 2021-01-05 DIAGNOSIS — R531 Weakness: Secondary | ICD-10-CM | POA: Diagnosis not present

## 2021-01-14 DIAGNOSIS — M25552 Pain in left hip: Secondary | ICD-10-CM | POA: Diagnosis not present

## 2021-01-18 ENCOUNTER — Ambulatory Visit (INDEPENDENT_AMBULATORY_CARE_PROVIDER_SITE_OTHER): Payer: Medicare Other | Admitting: Neurology

## 2021-01-18 ENCOUNTER — Encounter: Payer: Self-pay | Admitting: Neurology

## 2021-01-18 VITALS — BP 116/75 | HR 101 | Ht 64.0 in | Wt 267.0 lb

## 2021-01-18 DIAGNOSIS — Z6841 Body Mass Index (BMI) 40.0 and over, adult: Secondary | ICD-10-CM

## 2021-01-18 DIAGNOSIS — D860 Sarcoidosis of lung: Secondary | ICD-10-CM | POA: Diagnosis not present

## 2021-01-18 DIAGNOSIS — G459 Transient cerebral ischemic attack, unspecified: Secondary | ICD-10-CM | POA: Diagnosis not present

## 2021-01-18 DIAGNOSIS — I2699 Other pulmonary embolism without acute cor pulmonale: Secondary | ICD-10-CM | POA: Insufficient documentation

## 2021-01-18 DIAGNOSIS — G4733 Obstructive sleep apnea (adult) (pediatric): Secondary | ICD-10-CM | POA: Insufficient documentation

## 2021-01-18 DIAGNOSIS — M2619 Other specified anomalies of jaw-cranial base relationship: Secondary | ICD-10-CM | POA: Diagnosis not present

## 2021-01-18 DIAGNOSIS — R55 Syncope and collapse: Secondary | ICD-10-CM | POA: Insufficient documentation

## 2021-01-18 DIAGNOSIS — I2782 Chronic pulmonary embolism: Secondary | ICD-10-CM | POA: Diagnosis not present

## 2021-01-18 DIAGNOSIS — G4734 Idiopathic sleep related nonobstructive alveolar hypoventilation: Secondary | ICD-10-CM | POA: Diagnosis not present
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
## 2021-01-19 DIAGNOSIS — E1169 Type 2 diabetes mellitus with other specified complication: Secondary | ICD-10-CM | POA: Diagnosis not present

## 2021-01-25 DIAGNOSIS — R600 Localized edema: Secondary | ICD-10-CM | POA: Diagnosis not present

## 2021-02-05 DIAGNOSIS — R531 Weakness: Secondary | ICD-10-CM | POA: Diagnosis not present

## 2021-02-21 DIAGNOSIS — E1169 Type 2 diabetes mellitus with other specified complication: Secondary | ICD-10-CM | POA: Diagnosis not present

## 2021-02-21 DIAGNOSIS — I10 Essential (primary) hypertension: Secondary | ICD-10-CM | POA: Diagnosis not present

## 2021-02-21 DIAGNOSIS — E1149 Type 2 diabetes mellitus with other diabetic neurological complication: Secondary | ICD-10-CM | POA: Diagnosis not present

## 2021-02-21 DIAGNOSIS — E7800 Pure hypercholesterolemia, unspecified: Secondary | ICD-10-CM | POA: Diagnosis not present

## 2021-02-21 DIAGNOSIS — G8929 Other chronic pain: Secondary | ICD-10-CM | POA: Diagnosis not present

## 2021-02-21 DIAGNOSIS — M1711 Unilateral primary osteoarthritis, right knee: Secondary | ICD-10-CM | POA: Diagnosis not present

## 2021-02-21 DIAGNOSIS — M47816 Spondylosis without myelopathy or radiculopathy, lumbar region: Secondary | ICD-10-CM | POA: Diagnosis not present

## 2021-02-23 DIAGNOSIS — M545 Low back pain, unspecified: Secondary | ICD-10-CM | POA: Diagnosis not present

## 2021-02-23 DIAGNOSIS — M1711 Unilateral primary osteoarthritis, right knee: Secondary | ICD-10-CM | POA: Diagnosis not present

## 2021-02-23 DIAGNOSIS — G8929 Other chronic pain: Secondary | ICD-10-CM | POA: Diagnosis not present

## 2021-02-23 DIAGNOSIS — I10 Essential (primary) hypertension: Secondary | ICD-10-CM | POA: Diagnosis not present

## 2021-02-23 DIAGNOSIS — M47816 Spondylosis without myelopathy or radiculopathy, lumbar region: Secondary | ICD-10-CM | POA: Diagnosis not present

## 2021-02-23 DIAGNOSIS — E7800 Pure hypercholesterolemia, unspecified: Secondary | ICD-10-CM | POA: Diagnosis not present

## 2021-02-23 DIAGNOSIS — R2681 Unsteadiness on feet: Secondary | ICD-10-CM | POA: Diagnosis not present

## 2021-02-23 DIAGNOSIS — E1169 Type 2 diabetes mellitus with other specified complication: Secondary | ICD-10-CM | POA: Diagnosis not present

## 2021-02-23 DIAGNOSIS — Z79891 Long term (current) use of opiate analgesic: Secondary | ICD-10-CM | POA: Diagnosis not present

## 2021-02-23 DIAGNOSIS — E1149 Type 2 diabetes mellitus with other diabetic neurological complication: Secondary | ICD-10-CM | POA: Diagnosis not present

## 2021-03-08 DIAGNOSIS — R531 Weakness: Secondary | ICD-10-CM | POA: Diagnosis not present

## 2021-03-20 DIAGNOSIS — Z1389 Encounter for screening for other disorder: Secondary | ICD-10-CM | POA: Diagnosis not present

## 2021-03-20 DIAGNOSIS — E1169 Type 2 diabetes mellitus with other specified complication: Secondary | ICD-10-CM | POA: Diagnosis not present

## 2021-03-20 DIAGNOSIS — E7800 Pure hypercholesterolemia, unspecified: Secondary | ICD-10-CM | POA: Diagnosis not present

## 2021-03-20 DIAGNOSIS — Z23 Encounter for immunization: Secondary | ICD-10-CM | POA: Diagnosis not present

## 2021-03-20 DIAGNOSIS — R609 Edema, unspecified: Secondary | ICD-10-CM | POA: Diagnosis not present

## 2021-03-20 DIAGNOSIS — Z0000 Encounter for general adult medical examination without abnormal findings: Secondary | ICD-10-CM | POA: Diagnosis not present

## 2021-03-22 ENCOUNTER — Ambulatory Visit (INDEPENDENT_AMBULATORY_CARE_PROVIDER_SITE_OTHER): Payer: Medicare Other | Admitting: Neurology

## 2021-03-22 ENCOUNTER — Other Ambulatory Visit: Payer: Self-pay

## 2021-03-22 DIAGNOSIS — M2619 Other specified anomalies of jaw-cranial base relationship: Secondary | ICD-10-CM

## 2021-03-22 DIAGNOSIS — D860 Sarcoidosis of lung: Secondary | ICD-10-CM

## 2021-03-22 DIAGNOSIS — G459 Transient cerebral ischemic attack, unspecified: Secondary | ICD-10-CM

## 2021-03-22 DIAGNOSIS — G4734 Idiopathic sleep related nonobstructive alveolar hypoventilation: Secondary | ICD-10-CM

## 2021-03-22 DIAGNOSIS — R55 Syncope and collapse: Secondary | ICD-10-CM

## 2021-03-22 DIAGNOSIS — G4733 Obstructive sleep apnea (adult) (pediatric): Secondary | ICD-10-CM

## 2021-03-22 DIAGNOSIS — I2782 Chronic pulmonary embolism: Secondary | ICD-10-CM

## 2021-03-22 DIAGNOSIS — Z6841 Body Mass Index (BMI) 40.0 and over, adult: Secondary | ICD-10-CM

## 2021-03-23 DIAGNOSIS — E1169 Type 2 diabetes mellitus with other specified complication: Secondary | ICD-10-CM | POA: Diagnosis not present

## 2021-04-01 ENCOUNTER — Encounter (HOSPITAL_COMMUNITY): Payer: Self-pay | Admitting: Emergency Medicine

## 2021-04-01 ENCOUNTER — Emergency Department (HOSPITAL_COMMUNITY)
Admission: EM | Admit: 2021-04-01 | Discharge: 2021-04-01 | Disposition: A | Discharge status: Home / Self Care | Payer: Medicare Other | Attending: Emergency Medicine | Admitting: Emergency Medicine

## 2021-04-01 ENCOUNTER — Other Ambulatory Visit: Payer: Self-pay

## 2021-04-01 ENCOUNTER — Emergency Department (HOSPITAL_COMMUNITY): Payer: Medicare Other

## 2021-04-01 DIAGNOSIS — R0602 Shortness of breath: Secondary | ICD-10-CM | POA: Insufficient documentation

## 2021-04-01 DIAGNOSIS — R059 Cough, unspecified: Secondary | ICD-10-CM | POA: Diagnosis not present

## 2021-04-01 DIAGNOSIS — E785 Hyperlipidemia, unspecified: Secondary | ICD-10-CM | POA: Insufficient documentation

## 2021-04-01 DIAGNOSIS — Z79899 Other long term (current) drug therapy: Secondary | ICD-10-CM | POA: Insufficient documentation

## 2021-04-01 DIAGNOSIS — R079 Chest pain, unspecified: Secondary | ICD-10-CM | POA: Diagnosis not present

## 2021-04-01 DIAGNOSIS — Z7984 Long term (current) use of oral hypoglycemic drugs: Secondary | ICD-10-CM | POA: Diagnosis not present

## 2021-04-01 DIAGNOSIS — J9811 Atelectasis: Secondary | ICD-10-CM | POA: Diagnosis not present

## 2021-04-01 DIAGNOSIS — E1169 Type 2 diabetes mellitus with other specified complication: Secondary | ICD-10-CM | POA: Diagnosis not present

## 2021-04-01 DIAGNOSIS — R072 Precordial pain: Secondary | ICD-10-CM | POA: Diagnosis not present

## 2021-04-01 DIAGNOSIS — R0789 Other chest pain: Secondary | ICD-10-CM | POA: Diagnosis not present

## 2021-04-01 DIAGNOSIS — R918 Other nonspecific abnormal finding of lung field: Secondary | ICD-10-CM | POA: Diagnosis not present

## 2021-04-01 DIAGNOSIS — Z20822 Contact with and (suspected) exposure to covid-19: Secondary | ICD-10-CM | POA: Diagnosis not present

## 2021-04-01 DIAGNOSIS — Z96652 Presence of left artificial knee joint: Secondary | ICD-10-CM | POA: Diagnosis not present

## 2021-04-01 DIAGNOSIS — Z87891 Personal history of nicotine dependence: Secondary | ICD-10-CM | POA: Diagnosis not present

## 2021-04-01 DIAGNOSIS — Z7982 Long term (current) use of aspirin: Secondary | ICD-10-CM | POA: Insufficient documentation

## 2021-04-01 DIAGNOSIS — I10 Essential (primary) hypertension: Secondary | ICD-10-CM | POA: Diagnosis not present

## 2021-04-01 DIAGNOSIS — R12 Heartburn: Secondary | ICD-10-CM | POA: Diagnosis not present

## 2021-04-01 LAB — CBC
HCT: 45.3 % (ref 36.0–46.0)
Hemoglobin: 14 g/dL (ref 12.0–15.0)
MCH: 27.9 pg (ref 26.0–34.0)
MCHC: 30.9 g/dL (ref 30.0–36.0)
MCV: 90.4 fL (ref 80.0–100.0)
Platelets: 223 10*3/uL (ref 150–400)
RBC: 5.01 MIL/uL (ref 3.87–5.11)
RDW: 12.7 % (ref 11.5–15.5)
WBC: 3.4 10*3/uL — ABNORMAL LOW (ref 4.0–10.5)
nRBC: 0 % (ref 0.0–0.2)

## 2021-04-01 LAB — RESP PANEL BY RT-PCR (FLU A&B, COVID) ARPGX2
Influenza A by PCR: NEGATIVE
Influenza B by PCR: NEGATIVE
SARS Coronavirus 2 by RT PCR: NEGATIVE

## 2021-04-01 LAB — BASIC METABOLIC PANEL
Anion gap: 10 (ref 5–15)
BUN: 5 mg/dL — ABNORMAL LOW (ref 8–23)
CO2: 26 mmol/L (ref 22–32)
Calcium: 9.8 mg/dL (ref 8.9–10.3)
Chloride: 102 mmol/L (ref 98–111)
Creatinine, Ser: 0.64 mg/dL (ref 0.44–1.00)
GFR, Estimated: >60 mL/min (ref >60)
Glucose, Bld: 102 mg/dL — ABNORMAL HIGH (ref 70–99)
Potassium: 3.6 mmol/L (ref 3.5–5.1)
Sodium: 138 mmol/L (ref 135–145)

## 2021-04-01 LAB — TROPONIN I (HIGH SENSITIVITY)
Troponin I (High Sensitivity): 3 ng/L (ref <18)
Troponin I (High Sensitivity): 4 ng/L (ref <18)

## 2021-04-01 LAB — BRAIN NATRIURETIC PEPTIDE: B Natriuretic Peptide: 16.4 pg/mL (ref 0.0–100.0)

## 2021-04-01 LAB — D-DIMER, QUANTITATIVE: D-Dimer, Quant: 0.96 ug/mL-FEU — ABNORMAL HIGH (ref 0.00–0.50)

## 2021-04-01 MED ORDER — ALUM & MAG HYDROXIDE-SIMETH 200-200-20 MG/5ML PO SUSP
30.0000 mL | Freq: Once | ORAL | Status: AC
  Administered 2021-04-01: 30 mL via ORAL
  Filled 2021-04-01: qty 30

## 2021-04-01 MED ORDER — LIDOCAINE VISCOUS HCL 2 % MT SOLN
15.0000 mL | Freq: Once | OROMUCOSAL | Status: AC
  Administered 2021-04-01: 15 mL via ORAL
  Filled 2021-04-01: qty 15

## 2021-04-01 MED ORDER — IOHEXOL 350 MG/ML SOLN
132.0000 mL | Freq: Once | INTRAVENOUS | Status: AC | PRN
  Administered 2021-04-01: 132 mL via INTRAVENOUS

## 2021-04-01 NOTE — ED Notes (Signed)
Pt in CT.

## 2021-04-01 NOTE — ED Notes (Signed)
Help get patient onto the bed the bed rails are up

## 2021-04-01 NOTE — ED Notes (Signed)
EDP at BS 

## 2021-04-01 NOTE — ED Provider Notes (Signed)
Patient seen after prior ED provider.  Imaging obtained is without evidence of significant acute pathology.  Patient appears to be comfortable  Work-up in the ED tonight is without evidence of significant acute pathology.  Patient feels improved.  She desires discharge home.  She does understand need for close follow-up with her outpatient providers.  She declines additional ED evaluation or observation.  Importance of close follow-up is stressed.  Strict return precautions given and understood.   Valarie Merino, MD 04/01/21 2006

## 2021-04-01 NOTE — ED Provider Notes (Signed)
Emergency Medicine Provider Triage Evaluation Note  Samantha [name] , a 70 y.o. female  was evaluated in triage.  Pt complains of left lower side chest tightness.  Onset yesterday with shortness of breath.  Patient is concerned because her blood pressure has been elevated, this morning was 139 over 90s, was told to discontinue her lisinopril however she states she takes this because she has had a stroke in the past and she is not happy this medications been discontinued.  Pain does not radiate, not exertional.  Review of Systems  Positive: CP,. SHOB, lower extremity swelling Negative: Fever, chills  Physical Exam  There were no vitals taken for this visit. Gen:   Awake, no distress   Resp:  Normal effort  MSK:   Moves extremities without difficulty  Other:    Medical Decision Making  Medically screening exam initiated at 9:54 AM.  Appropriate orders placed.  Samantha [name] was informed that the remainder of the evaluation will be completed by another provider, this initial triage assessment does not replace that evaluation, and the importance of remaining in the ED until their evaluation is complete.     Tacy Learn, PA-C 04/01/21 0045    Carmin Muskrat, MD 04/01/21 [phone]

## 2021-04-01 NOTE — ED Notes (Signed)
Back from CT, pending results, reading a book, alert, NAD, calm, interactive.

## 2021-04-01 NOTE — ED Notes (Signed)
Back to h/w stretcher from w/r. Protocol labs, xray, EKG and VS reviewed. Pt alert, NAD, calm, interactive, resps e/u, speaking in clear complete sentences, pharm tech finished at [facility].

## 2021-04-01 NOTE — Discharge Instructions (Addendum)
Return for any problem.  ?

## 2021-04-01 NOTE — ED Triage Notes (Signed)
Pt states she was took off BP medication 2 weeks ago.  Reports tightness to center of chest since yesterday.  Denies SOB, nausea, vomiting, and dizziness.
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
## 2021-04-06 DIAGNOSIS — N61 Mastitis without abscess: Secondary | ICD-10-CM | POA: Diagnosis not present

## 2021-04-06 DIAGNOSIS — N6313 Unspecified lump in the right breast, lower outer quadrant: Secondary | ICD-10-CM | POA: Diagnosis not present

## 2021-04-07 DIAGNOSIS — E7800 Pure hypercholesterolemia, unspecified: Secondary | ICD-10-CM | POA: Diagnosis not present

## 2021-04-07 DIAGNOSIS — E1149 Type 2 diabetes mellitus with other diabetic neurological complication: Secondary | ICD-10-CM | POA: Diagnosis not present

## 2021-04-07 DIAGNOSIS — E1169 Type 2 diabetes mellitus with other specified complication: Secondary | ICD-10-CM | POA: Diagnosis not present

## 2021-04-07 DIAGNOSIS — I10 Essential (primary) hypertension: Secondary | ICD-10-CM | POA: Diagnosis not present

## 2021-04-07 DIAGNOSIS — R531 Weakness: Secondary | ICD-10-CM | POA: Diagnosis not present

## 2021-04-07 DIAGNOSIS — M1711 Unilateral primary osteoarthritis, right knee: Secondary | ICD-10-CM | POA: Diagnosis not present

## 2021-04-07 DIAGNOSIS — M47816 Spondylosis without myelopathy or radiculopathy, lumbar region: Secondary | ICD-10-CM | POA: Diagnosis not present

## 2021-04-07 DIAGNOSIS — G8929 Other chronic pain: Secondary | ICD-10-CM | POA: Diagnosis not present

## 2021-04-10 ENCOUNTER — Other Ambulatory Visit: Payer: Self-pay | Admitting: Neurology

## 2021-04-10 ENCOUNTER — Telehealth: Payer: Self-pay | Admitting: Neurology

## 2021-04-10 DIAGNOSIS — G4734 Idiopathic sleep related nonobstructive alveolar hypoventilation: Secondary | ICD-10-CM

## 2021-04-10 DIAGNOSIS — D860 Sarcoidosis of lung: Secondary | ICD-10-CM

## 2021-04-10 DIAGNOSIS — G4733 Obstructive sleep apnea (adult) (pediatric): Secondary | ICD-10-CM

## 2021-04-10 NOTE — Telephone Encounter (Signed)
Pt called, have not heard from anyone regarding sleep results. Would like a call from the nurse.

## 2021-04-10 NOTE — Telephone Encounter (Signed)
Called and reviewed the sleep study results with the patient. Advised there was no significant sleep apnea present on the study. There was indication of the oxygen level still dropping and she did require 2 L of oxygen at bedtime. Advised that we will need to get a referral to pulmonology in order for them to further evaluate the cause behind the low oxygen. She has a oxygen concentrator that she was dc home with from the hospital. She has used that and will continue to do so until following up with pulmonologist

## 2021-04-10 NOTE — Progress Notes (Signed)
IMPRESSION:  1. No Evidence of Obstructive Sleep Apnea (OSA) or any sleep apnea.  2. No Periodic Limb Movement Disorder (PLMD) 3. Mild Snoring 4. SleepHypoxia was sustained and severe before oxygen was added. Final oxygen supplement at 2 L. 5. Mostly REM sleep was associated with hypoxemia.     RECOMMENDATIONS:  1. Advise referral to Pulmonology for evaluation / causes of hypoxia. ( PCP , LISA MILLER )  2. The hypoxia was not related to apnea, and only those related to apnea can be treated in the sleep lab.

## 2021-04-10 NOTE — Telephone Encounter (Signed)
Pt completed in lab study on 03/22/2021. I will forward to Dr Brett Fairy to advise that the patient is calling to ask about the results. Once we have them I will reach out to the patient to discuss

## 2021-04-10 NOTE — Telephone Encounter (Signed)
-----   Message from Larey Seat, MD sent at 04/10/2021  4:51 PM EDT ----- IMPRESSION:  1. No Evidence of Obstructive Sleep Apnea (OSA) or any sleep apnea.  2. No Periodic Limb Movement Disorder (PLMD) 3. Mild Snoring 4. SleepHypoxia was sustained and severe before oxygen was added. Final oxygen supplement at 2 L. 5. Mostly REM sleep was associated with hypoxemia.     RECOMMENDATIONS:  1. Advise referral to Pulmonology for evaluation / causes of hypoxia. ( PCP , LISA MILLER )  2. The hypoxia was not related to apnea, and only those related to apnea can be treated in the sleep lab.

## 2021-04-10 NOTE — Procedures (Signed)
PATIENT'S NAME:  Samantha [name], Samantha [name] DOB:      [birth year]      MR#:    [id number]     DATE OF RECORDING: 03/22/2021 REFERRING M.D.:  Eric [place] D.O. Study Performed:   Baseline Polysomnogram HISTORY:  Samantha [name] is a 70 - year-old [place] American female patient and was seen upon a referral from Dr [place] DO , Hospitalist at [facility], for a Sleep consultation. She has a medical history of Anxiety, Diabetes mellitus type 2, Hyperlipemia, Hypertension, PE 1973 (pulmonary embolism), non-seasonal Rhinitis, Sarcoidosis of lung ([place]), TIA times 2 in 6 weeks in 1990s (transient ischemic attack).  Chief concern according to patient: "I have gained weight, and now I snore, and while in the hospital the RN and CNAs were alarmed about my low oxygen and suspected nocturnal apnea. I started using oxygen when sleeping now- at home on 2 liters.    The patient endorsed the Epworth Sleepiness Scale at 8/24 points. FSS at 20/63 points- both scores are lower since being on oxygen    The patient's weight 267 pounds with a height of 64 (inches), resulting in a BMI of 45.5 kg/m2. The patient's neck circumference measured 15.8 inches.  CURRENT MEDICATIONS: Tylenol, Aspirin, Vitamin D, Flexeril, Flonase, Lasix, Amaryl, Claritin, Fish oil, Ditropan, Percocet, Probiotic, B-6, Crestor, Vitamin B12   PROCEDURE:  This is a multichannel digital polysomnogram utilizing the Somnostar 11.2 system.  Electrodes and sensors were applied and monitored per AASM Specifications.   EEG, EOG, Chin and Limb EMG, were sampled at 200 Hz.  ECG, Snore and Nasal Pressure, Thermal Airflow, Respiratory Effort, CPAP Flow and Pressure, Oximetry was sampled at 50 Hz. Digital video and audio were recorded.      BASELINE STUDY: Lights Out was at 22:11 and Lights On at 04:56.  Total recording time (TRT) was 405.5 minutes, with a total sleep time (TST) of 327 minutes.   The patient's sleep latency was 17.5 minutes.  REM latency was 121 minutes.  The  sleep efficiency was 80.6 %.     SLEEP ARCHITECTURE: WASO (Wake after sleep onset) was 71 minutes.  There were 8.5 minutes in Stage N1, 256.5 minutes Stage N2, 30 minutes Stage N3 and 32 minutes in Stage REM.  The percentage of Stage N1 was 2.6%, Stage N2 was 78.4%, Stage N3 was 9.2% and Stage R (REM sleep) was 9.8%.   RESPIRATORY ANALYSIS:  There were a total of 16 respiratory events:  8 obstructive apneas, 0 central apneas and 3 mixed apneas with a total of 11 apneas and an apnea index (AI) of 2.0 /hour. There were 5 hypopneas with a hypopnea index of 0.9 /hour.      The total APNEA/HYPOPNEA INDEX (AHI) was 2.9/hour and the total RESPIRATORY DISTURBANCE INDEX was 2.9 /hour.   9 events occurred in REM sleep and 4 events in NREM. The REM AHI was 16.9 /hour, versus a non-REM AHI of 1.4. The patient spent 0 minutes of total sleep time in the supine position and 327 minutes in non-supine. The supine AHI was 0.0 versus a non-supine AHI of 2.9.  OXYGEN SATURATION & C02:  The Wake baseline 02 saturation was 93%, with the Nadir being 68%. Time spent below 89% saturation equaled 16 minutes.  After the patient developed several minutes in sustained hypoxia ( 15 minutes) , the RPSGT [place]) added 1 L oxygen at 12.32 AM and further increased to 2 liters at 3.05 AM.  The arousals were noted as: 37 were spontaneous, 0  were associated with PLMs, 2 were associated with respiratory events. The patient had a total of 0 Periodic Limb Movements.   Audio and video analysis did not show any abnormal or unusual movements, behaviors, phonations or vocalizations.   Intermittent, mild snoring noted. EKG was in keeping with normal, regular rhythm (SR).   IMPRESSION:  No Evidence of Obstructive Sleep Apnea (OSA) or any sleep apnea.  No Periodic Limb Movement Disorder (PLMD) Mild Snoring SleepHypoxia was sustained and severe before oxygen was added. Final oxygen supplement at 2 L. Mostly REM sleep was associated with  hypoxemia.     RECOMMENDATIONS:  Advise referral to Pulmonology for evaluation / causes of hypoxia.  The hypoxia was not related to apnea, and only those related to apnea can be treated in the sleep lab.    I certify that I have reviewed the entire raw data recording prior to the issuance of this report in accordance with the Standards of Accreditation of the American Academy of Sleep Medicine (AASM)    Larey Seat, MD Diplomat, American Board of Psychiatry and Neurology  Diplomat, American Board of Sleep Medicine Market researcher, [place] [facility] [employer]

## 2021-04-24 DIAGNOSIS — N6489 Other specified disorders of breast: Secondary | ICD-10-CM | POA: Diagnosis not present

## 2021-04-24 DIAGNOSIS — R918 Other nonspecific abnormal finding of lung field: Secondary | ICD-10-CM | POA: Diagnosis not present

## 2021-04-24 DIAGNOSIS — N6313 Unspecified lump in the right breast, lower outer quadrant: Secondary | ICD-10-CM | POA: Diagnosis not present

## 2021-04-24 DIAGNOSIS — E1169 Type 2 diabetes mellitus with other specified complication: Secondary | ICD-10-CM | POA: Diagnosis not present
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
## 2021-04-26 ENCOUNTER — Encounter: Payer: Self-pay | Admitting: Internal Medicine

## 2021-04-26 ENCOUNTER — Other Ambulatory Visit: Payer: Self-pay

## 2021-04-26 ENCOUNTER — Ambulatory Visit (INDEPENDENT_AMBULATORY_CARE_PROVIDER_SITE_OTHER): Payer: Medicare Other | Admitting: Internal Medicine

## 2021-04-26 VITALS — BP 128/70 | HR 100 | Temp 98.4°F | Ht 64.75 in | Wt 257.0 lb

## 2021-04-26 DIAGNOSIS — G4734 Idiopathic sleep related nonobstructive alveolar hypoventilation: Secondary | ICD-10-CM | POA: Diagnosis not present

## 2021-04-26 DIAGNOSIS — R400 Somnolence: Secondary | ICD-10-CM | POA: Diagnosis not present

## 2021-04-26 DIAGNOSIS — D869 Sarcoidosis, unspecified: Secondary | ICD-10-CM

## 2021-04-26 NOTE — Assessment & Plan Note (Signed)
No OSA based on available sleep study. Option to recheck in future. Former smoker with hx sarcoid and PE in past. Basilar fibrosis and adenopathy may be residual from sarcoid- can be watched over time. Most likely this is Obesity-Hypoventilation. Acute lung disease not evident now.  Plan- continue O2 2L for sleep, encourage weight loss, schedule PFT, ACE leevel.

## 2021-04-26 NOTE — Patient Instructions (Signed)
Order- lab   ACE angiotensin converting enzyme level      dx Sarcoid  Order- Schedule PFT    dx Sarcoid  I do encourage you to get back into an exercise class as discussed  Try getting more caffeine during the day. See if this and maybe an occasional, deliberate short nap, can help with the daytime sleepiness.  Please call if we can help
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
## 2021-04-26 NOTE — Assessment & Plan Note (Signed)
Only came to her attention in past few months. Possible relation to cerebrovascular disease/ hx TIAs. Mostly postprandial in an obese, sedentary person. She endorsed Epworth score of only 6. She doesn't describe poor nocturnal sleep quality.  Plan- Encourage activity, avoid stasis. Ok to try more caffeine- a cup of coffee with breakfast and lunch. Possibly future NPSG/ MSLT, but probably low yield.

## 2021-05-08 DIAGNOSIS — R531 Weakness: Secondary | ICD-10-CM | POA: Diagnosis not present

## 2021-05-16 DIAGNOSIS — E7800 Pure hypercholesterolemia, unspecified: Secondary | ICD-10-CM | POA: Diagnosis not present

## 2021-05-16 DIAGNOSIS — G8929 Other chronic pain: Secondary | ICD-10-CM | POA: Diagnosis not present

## 2021-05-16 DIAGNOSIS — E1149 Type 2 diabetes mellitus with other diabetic neurological complication: Secondary | ICD-10-CM | POA: Diagnosis not present

## 2021-05-16 DIAGNOSIS — E1169 Type 2 diabetes mellitus with other specified complication: Secondary | ICD-10-CM | POA: Diagnosis not present

## 2021-05-16 DIAGNOSIS — M47816 Spondylosis without myelopathy or radiculopathy, lumbar region: Secondary | ICD-10-CM | POA: Diagnosis not present

## 2021-05-16 DIAGNOSIS — M1711 Unilateral primary osteoarthritis, right knee: Secondary | ICD-10-CM | POA: Diagnosis not present

## 2021-05-16 DIAGNOSIS — I10 Essential (primary) hypertension: Secondary | ICD-10-CM | POA: Diagnosis not present

## 2021-05-24 DIAGNOSIS — E1169 Type 2 diabetes mellitus with other specified complication: Secondary | ICD-10-CM | POA: Diagnosis not present

## 2021-06-07 DIAGNOSIS — R531 Weakness: Secondary | ICD-10-CM | POA: Diagnosis not present

## 2021-06-09 DIAGNOSIS — R300 Dysuria: Secondary | ICD-10-CM | POA: Diagnosis not present

## 2021-06-19 ENCOUNTER — Emergency Department (HOSPITAL_COMMUNITY)
Admission: EM | Admit: 2021-06-19 | Discharge: 2021-06-20 | Disposition: A | Discharge status: Home / Self Care | Payer: Medicare Other | Attending: Emergency Medicine | Admitting: Emergency Medicine

## 2021-06-19 ENCOUNTER — Encounter (HOSPITAL_COMMUNITY): Payer: Self-pay

## 2021-06-19 ENCOUNTER — Emergency Department (HOSPITAL_COMMUNITY): Payer: Medicare Other

## 2021-06-19 ENCOUNTER — Other Ambulatory Visit: Payer: Self-pay

## 2021-06-19 DIAGNOSIS — I878 Other specified disorders of veins: Secondary | ICD-10-CM | POA: Diagnosis not present

## 2021-06-19 DIAGNOSIS — M79606 Pain in leg, unspecified: Secondary | ICD-10-CM | POA: Diagnosis not present

## 2021-06-19 DIAGNOSIS — I10 Essential (primary) hypertension: Secondary | ICD-10-CM | POA: Diagnosis not present

## 2021-06-19 DIAGNOSIS — Z7982 Long term (current) use of aspirin: Secondary | ICD-10-CM | POA: Insufficient documentation

## 2021-06-19 DIAGNOSIS — Z87891 Personal history of nicotine dependence: Secondary | ICD-10-CM | POA: Insufficient documentation

## 2021-06-19 DIAGNOSIS — M79605 Pain in left leg: Secondary | ICD-10-CM | POA: Diagnosis not present

## 2021-06-19 DIAGNOSIS — Z96652 Presence of left artificial knee joint: Secondary | ICD-10-CM | POA: Diagnosis not present

## 2021-06-19 DIAGNOSIS — Z743 Need for continuous supervision: Secondary | ICD-10-CM | POA: Diagnosis not present

## 2021-06-19 DIAGNOSIS — M47816 Spondylosis without myelopathy or radiculopathy, lumbar region: Secondary | ICD-10-CM | POA: Diagnosis not present

## 2021-06-19 DIAGNOSIS — M25552 Pain in left hip: Secondary | ICD-10-CM | POA: Diagnosis not present

## 2021-06-19 DIAGNOSIS — S7012XA Contusion of left thigh, initial encounter: Secondary | ICD-10-CM | POA: Diagnosis not present

## 2021-06-19 DIAGNOSIS — Z79899 Other long term (current) drug therapy: Secondary | ICD-10-CM | POA: Diagnosis not present

## 2021-06-19 DIAGNOSIS — M79662 Pain in left lower leg: Secondary | ICD-10-CM | POA: Diagnosis not present

## 2021-06-19 DIAGNOSIS — X501XXA Overexertion from prolonged static or awkward postures, initial encounter: Secondary | ICD-10-CM | POA: Diagnosis not present

## 2021-06-19 DIAGNOSIS — E119 Type 2 diabetes mellitus without complications: Secondary | ICD-10-CM | POA: Diagnosis not present

## 2021-06-19 DIAGNOSIS — M778 Other enthesopathies, not elsewhere classified: Secondary | ICD-10-CM | POA: Diagnosis not present

## 2021-06-19 NOTE — ED Provider Notes (Signed)
Emergency Medicine Provider Triage Evaluation Note  Samantha [name] , a 70 y.o. female  was evaluated in triage.  Pt complains of intermittent pain to left thigh.  States that pain started this evening at approximately 1900.  Has been coming and going since then.  Denies any recent falls or injuries.  Patient expresses concern for possible DVT.  States that she had a blood clot in 1976.  Denies any current anticoagulation.  Patient also expresses that she has problems with her left hip.    Review of Systems  Positive: Left thigh and hip pain Negative: Numbness, weakness, color change, pallor, swelling or tenderness to bilateral lower extremities  Physical Exam  BP (!) 152/69 (BP Location: Left Arm)    Pulse (!) 113    Temp 99 F (37.2 C) (Oral)    Resp 16    Ht 5\' 4"  (1.626 m)    Wt 116.6 kg    SpO2 98%    BMI 44.11 kg/m  Gen:   Awake, no distress   Resp:  Normal effort  MSK:   Moves extremities without difficulty, patient has diffuse tenderness to left hip and left upper leg Other:    Medical Decision Making  Medically screening exam initiated at 9:40 PM.  Appropriate orders placed.  Mimie Goering was informed that the remainder of the evaluation will be completed by another provider, this initial triage assessment does not replace that evaluation, and the importance of remaining in the ED until their evaluation is complete.  Due to tenderness patient requesting x-ray imaging.  X-ray of left hip placed at this time.   Loni Beckwith, PA-C 06/19/21 2142    Jeanell Sparrow, DO 06/21/21 [phone]

## 2021-06-19 NOTE — ED Triage Notes (Signed)
Pt BIB EMS from home with left leg pain since today. Pt states that pain is upper leg area. Pt reports that she had a clot in 1976.

## 2021-06-20 ENCOUNTER — Ambulatory Visit (HOSPITAL_BASED_OUTPATIENT_CLINIC_OR_DEPARTMENT_OTHER)
Admission: RE | Admit: 2021-06-20 | Discharge: 2021-06-20 | Disposition: A | Discharge status: Home / Self Care | Payer: Medicare Other | Source: Ambulatory Visit | Attending: Emergency Medicine | Admitting: Emergency Medicine

## 2021-06-20 DIAGNOSIS — M79662 Pain in left lower leg: Secondary | ICD-10-CM | POA: Insufficient documentation

## 2021-06-20 DIAGNOSIS — M79605 Pain in left leg: Secondary | ICD-10-CM

## 2021-06-20 MED ORDER — RIVAROXABAN 15 MG PO TABS
15.0000 mg | ORAL_TABLET | Freq: Once | ORAL | Status: AC
  Administered 2021-06-20: 01:00:00 15 mg via ORAL
  Filled 2021-06-20: qty 1

## 2021-06-20 MED ORDER — RIVAROXABAN (XARELTO) VTE STARTER PACK (15 & 20 MG): ORAL_TABLET | ORAL | 0 refills | Status: DC

## 2021-06-20 NOTE — Progress Notes (Signed)
Lower extremity venous has been completed.   Preliminary results in CV Proc.   Samantha [name] 06/20/2021 11:42 AM

## 2021-06-20 NOTE — Discharge Instructions (Signed)
Please only get prescription filled if your ultrasound is positive for blood clot. Either way you need to follow up with your primary doctor for further evaluation and management.
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
## 2021-06-23 DIAGNOSIS — E1169 Type 2 diabetes mellitus with other specified complication: Secondary | ICD-10-CM | POA: Diagnosis not present

## 2021-06-29 DIAGNOSIS — Z1231 Encounter for screening mammogram for malignant neoplasm of breast: Secondary | ICD-10-CM | POA: Diagnosis not present

## 2021-07-08 DIAGNOSIS — R531 Weakness: Secondary | ICD-10-CM | POA: Diagnosis not present

## 2021-07-24 DIAGNOSIS — R0981 Nasal congestion: Secondary | ICD-10-CM | POA: Diagnosis not present

## 2021-07-24 DIAGNOSIS — J019 Acute sinusitis, unspecified: Secondary | ICD-10-CM | POA: Diagnosis not present

## 2021-07-25 DIAGNOSIS — E1169 Type 2 diabetes mellitus with other specified complication: Secondary | ICD-10-CM | POA: Diagnosis not present

## 2021-07-27 DIAGNOSIS — M25552 Pain in left hip: Secondary | ICD-10-CM | POA: Diagnosis not present

## 2021-08-08 DIAGNOSIS — R531 Weakness: Secondary | ICD-10-CM | POA: Diagnosis not present

## 2021-08-08 DIAGNOSIS — R195 Other fecal abnormalities: Secondary | ICD-10-CM | POA: Diagnosis not present

## 2021-08-22 DIAGNOSIS — E1169 Type 2 diabetes mellitus with other specified complication: Secondary | ICD-10-CM | POA: Diagnosis not present

## 2021-08-24 DIAGNOSIS — G8929 Other chronic pain: Secondary | ICD-10-CM | POA: Diagnosis not present

## 2021-08-24 DIAGNOSIS — I10 Essential (primary) hypertension: Secondary | ICD-10-CM | POA: Diagnosis not present

## 2021-08-24 DIAGNOSIS — E1169 Type 2 diabetes mellitus with other specified complication: Secondary | ICD-10-CM | POA: Diagnosis not present

## 2021-08-24 DIAGNOSIS — E7800 Pure hypercholesterolemia, unspecified: Secondary | ICD-10-CM | POA: Diagnosis not present

## 2021-08-25 ENCOUNTER — Ambulatory Visit: Payer: Medicare Other | Admitting: Internal Medicine

## 2021-08-28 DIAGNOSIS — R040 Epistaxis: Secondary | ICD-10-CM | POA: Diagnosis not present

## 2021-09-05 DIAGNOSIS — R531 Weakness: Secondary | ICD-10-CM | POA: Diagnosis not present

## 2021-09-14 DIAGNOSIS — Z79899 Other long term (current) drug therapy: Secondary | ICD-10-CM | POA: Diagnosis not present

## 2021-09-14 DIAGNOSIS — Z79891 Long term (current) use of opiate analgesic: Secondary | ICD-10-CM | POA: Diagnosis not present

## 2021-09-14 DIAGNOSIS — M545 Low back pain, unspecified: Secondary | ICD-10-CM | POA: Diagnosis not present

## 2021-09-19 DIAGNOSIS — R202 Paresthesia of skin: Secondary | ICD-10-CM | POA: Diagnosis not present

## 2021-09-19 DIAGNOSIS — E785 Hyperlipidemia, unspecified: Secondary | ICD-10-CM | POA: Diagnosis not present

## 2021-09-19 DIAGNOSIS — D171 Benign lipomatous neoplasm of skin and subcutaneous tissue of trunk: Secondary | ICD-10-CM | POA: Diagnosis not present

## 2021-09-19 DIAGNOSIS — I10 Essential (primary) hypertension: Secondary | ICD-10-CM | POA: Diagnosis not present

## 2021-09-19 DIAGNOSIS — E1169 Type 2 diabetes mellitus with other specified complication: Secondary | ICD-10-CM | POA: Diagnosis not present

## 2021-09-22 DIAGNOSIS — E1169 Type 2 diabetes mellitus with other specified complication: Secondary | ICD-10-CM | POA: Diagnosis not present

## 2021-09-28 ENCOUNTER — Ambulatory Visit: Payer: Medicare Other | Admitting: Nurse Practitioner

## 2021-09-30 NOTE — Progress Notes (Signed)
HPI ?F former smoker for sleep evaluation with concern of Chronic Hypoxic Respiratory Failure, chronic hypoxemia, Hx PE 1975,  Pulmonary Sarcoid,Obesity Hypoventilation,  (courtesy of Dr Brett Fairy), complicated by  TIA, PE, HTN, DM2, Morbid Obesity, Hyperlipidemia, Depression,  ?NPSG 03/22/21 [facility] (Dr Dohmeier)  AHI 2.9/ hr, desaturation to 68% with 10% of sleep time O2 sat 80-89%. Oxygen added at 2L with Mean O2 Sat 93%. Body weight 267 lbs. ?ECHO 12/06/20- EF 60-65%, RA 8 ?PFT 10/02/21- moderate restriction, moderate diffusion reduction ? ?============================================================= ? ?04/26/21- [name] former smoker for sleep evaluation with concern of Chronic Hypoxic Respiratory Failure, chronic hypoxemia, Hx PE,  Pulmonary Sarcoid, courtesy of Dr Dohmeier ?NPSG 03/22/21 [facility] (Dr Dohmeier)  AHI 2.9/ hr, desaturation to 68% with 10% of sleep time O2 sat 80-89%. Oxygen added at 2L with Mean O2 Sat 93%. Body weight 267 lbs. ?Medical problem list includes TIA, PE, HTN, Sarcoid, DM2, Morbid Obesity,  ?Hyperlipidemia, Depression,  ?Epworth score 6 ?O2 2l/ [employer] ?Body weight today-257 lbs ?Covid vax- ?Flu vax- ?Sleep hypoxia first noted in hospital this summer. Mild morning cough with scant white phlegm. Sarcoid dx by CXR in 1978. PE in 1970's, no longer with anticoagulant.  ?Physical activity markedly limited by back pain/ spinal stenosis. Speaks of rejoining a water exercise class, but effectively gets no exercise now. Sleeps well at night. Occasional nap. Does note that in last 6 months she sometimes falls asleep while sitting. No apparent triggered weakness such as cataplexy. This sleepiness most commonly after meals, lasting few minutes. ?Wearing oxygen now at night, she thinks she "feels better" on waking in the morning, but no more specific change.  ?Bedtime 11P-12MN, latency 5 minutes, waso 2-3 for bathroom, up 7-8AM. ?Hx 2 TIA's with residual left face weakness. She will ask her  Neurologist if sleepiness could be related. Not on obviously sedating meds.  ?Has avoided caffeine, thinking it was somehow bad for her. ?ECHO 12/06/20- EF 60-65%, RA 8 ?CTaChest 12/04/20- ?IMPRESSION: ?1. No evidence of pulmonary embolism. No acute intrathoracic ?process. ?2. Unchanged partially calcified mediastinal and bilateral hilar ?lymph nodes, consistent with history of sarcoidosis. ?3. Unchanged mild lower lobe pulmonary fibrosis. ?4. Unchanged cholelithiasis. ?5. Aortic Atherosclerosis (ICD10-I70.0). ?CTaPE 04/01/21- ?IMPRESSION: ?No definite evidence of pulmonary embolism. ?Calcified and noncalcified mediastinal and hilar adenopathy ?consistent with history of sarcoidosis. ?Cholelithiasis. ?Aortic Atherosclerosis (ICD10-I70.0).  Schmidt hila ? ?10/02/21-  7 yoF former smoker for sleep evaluation with concern of Chronic Hypoxic Respiratory Failure, chronic hypoxemia, Hx PE 1975,  Pulmonary Sarcoid,Obesity Hypoventilation,  (courtesy of Dr Brett Fairy), complicated by  TIA, PE, HTN, DM2, Morbid Obesity, Hyperlipidemia, Depression,  ?NPSG 03/22/21 [facility] (Dr Dohmeier)  AHI 2.9/ hr, desaturation to 68% with 10% of sleep time O2 sat 80-89%. Oxygen added at 2L with Mean O2 Sat 93%. Body weight 267 lbs. ?O2 2l/ [employer]                                Needs ACE level                            ?Body weight today-256 lbs ?Covid vax-4 Phizer ?Flu vax-had ?PFT 10/02/21- moderate restriction, moderate diffusion reduction ?-----Patient did PFT today. Wants to talk about wearing oxygen when sleeping.    ?Dr Dohmeier manages OSA .  Discussed O2 for sleep. Plans beach trip and has contacted [employer] for smaller concentrator.                  ? ? ?  ROS-see HPI   + = positive ?Constitutional:    weight loss, night sweats, fevers, chills, fatigue, lassitude. ?HEENT:    headaches, difficulty swallowing, tooth/dental problems, sore throat,  ?     sneezing, itching, ear ache, nasal congestion, post nasal drip, snoring ?CV:    chest  pain, orthopnea, PND, +swelling in lower extremities, anasarca,                  dizziness, palpitations ?Resp:   +shortness of breath with exertion or at rest.   ?             +productive cough,   non-productive cough, coughing up of blood.   ?           change in color of mucus.  wheezing.   ?Skin:    rash or lesions. ?GI:  No-   heartburn, indigestion, abdominal pain, nausea, vomiting, diarrhea,  ?               change in bowel habits, loss of appetite ?GU: dysuria, change in color of urine, no urgency or frequency.   flank pain. ?MS:   joint pain, stiffness, decreased range of motion, back pain. ?Neuro-     nothing unusual ?Psych:  change in mood or affect.  +depression or anxiety.   memory loss. ? ?OBJ- Physical Exam ?General- Alert, Oriented, Affect-appropriate, Distress- none acute, + morbidly obese, +wheelchair, +room air sat 97% ?Skin- rash-none, lesions- none, excoriation- none ?Lymphadenopathy- none ?Head- atraumatic ?           Eyes- Gross vision intact, PERRLA, conjunctivae and secretions clear ?           Ears- Hearing, canals-normal ?           Nose- Clear, no-Septal dev, mucus, polyps, erosion, perforation  ?           Throat- Mallampati III, mucosa clear , drainage- none, tonsils- atrophic,  ?+full dentures,  ?Neck- flexible , trachea midline, no stridor , thyroid nl, carotid no bruit ?Chest - symmetrical excursion , unlabored ?          Heart/CV- RRR , no murmur , no gallop  , no rub, nl s1 s2 ?                          - JVD- none , edema+2, stasis changes- none, varices- none ?          Lung- clear to P&A, wheeze- none, cough- none , dullness-none, rub- none ?          Chest wall-  ?Abd-  ?Br/ Gen/ Rectal- Not done, not indicated ?Extrem- cyanosis- none, clubbing, none, atrophy- none, strength- nl ?Neuro-+asked to tape-record our session, saying her memory not good. ? ?  ?

## 2021-10-02 ENCOUNTER — Encounter: Payer: Self-pay | Admitting: Internal Medicine

## 2021-10-02 ENCOUNTER — Ambulatory Visit (INDEPENDENT_AMBULATORY_CARE_PROVIDER_SITE_OTHER): Payer: Medicare Other | Admitting: Internal Medicine

## 2021-10-02 VITALS — BP 118/70 | HR 117 | Temp 98.6°F | Ht 64.0 in | Wt 256.2 lb

## 2021-10-02 DIAGNOSIS — D869 Sarcoidosis, unspecified: Secondary | ICD-10-CM | POA: Diagnosis not present

## 2021-10-02 DIAGNOSIS — J9611 Chronic respiratory failure with hypoxia: Secondary | ICD-10-CM | POA: Diagnosis not present

## 2021-10-02 DIAGNOSIS — D860 Sarcoidosis of lung: Secondary | ICD-10-CM

## 2021-10-02 LAB — PULMONARY FUNCTION TEST
DL/VA % pred: 111 %
DL/VA: 4.63 ml/min/mmHg/L
DLCO cor % pred: 50 %
DLCO cor: 9.92 ml/min/mmHg
DLCO unc % pred: 50 %
DLCO unc: 9.92 ml/min/mmHg
FEF 25-75 Post: 1.31 L/sec
FEF 25-75 Pre: 1.58 L/sec
FEF2575-%Change-Post: -16 %
FEF2575-%Pred-Post: 75 %
FEF2575-%Pred-Pre: 91 %
FEV1-%Change-Post: -3 %
FEV1-%Pred-Post: 65 %
FEV1-%Pred-Pre: 68 %
FEV1-Post: 1.23 L
FEV1-Pre: 1.28 L
FEV1FVC-%Change-Post: 0 %
FEV1FVC-%Pred-Pre: 108 %
FEV6-%Change-Post: -2 %
FEV6-%Pred-Post: 63 %
FEV6-%Pred-Pre: 64 %
FEV6-Post: 1.45 L
FEV6-Pre: 1.49 L
FEV6FVC-%Change-Post: 0 %
FEV6FVC-%Pred-Post: 103 %
FEV6FVC-%Pred-Pre: 104 %
FVC-%Change-Post: -3 %
FVC-%Pred-Post: 60 %
FVC-%Pred-Pre: 63 %
FVC-Post: 1.46 L
FVC-Pre: 1.51 L
Post FEV1/FVC ratio: 84 %
Post FEV6/FVC ratio: 99 %
Pre FEV1/FVC ratio: 84 %
Pre FEV6/FVC Ratio: 100 %
RV % pred: 6 %
RV: 0.15 L
TLC % pred: 74 %
TLC: 3.76 L

## 2021-10-02 NOTE — Patient Instructions (Addendum)
Order- lab- ACE level  dx Sarcoid ? ?Continue oxygen at 2 liters when sleeping.   Enjoy your trip ! ? ?Please call if we can help ?

## 2021-10-02 NOTE — Progress Notes (Signed)
Full PFT completed today ? ?

## 2021-10-03 LAB — ANGIOTENSIN CONVERTING ENZYME: Angiotensin-Converting Enzyme: 10 U/L (ref 9–67)

## 2021-10-05 ENCOUNTER — Telehealth: Payer: Self-pay | Admitting: Internal Medicine

## 2021-10-05 NOTE — Telephone Encounter (Signed)
Dr. Annamaria Boots, please advise on the results of the PFT that pt had performed day of OV 4/10. ?

## 2021-10-06 DIAGNOSIS — R531 Weakness: Secondary | ICD-10-CM | POA: Diagnosis not present

## 2021-10-07 NOTE — Telephone Encounter (Signed)
Thje pulmonary function test shows moderate restriction and reduced gas exchange. These are caused by the stable old scarring from past history of sarcoid which makes it hard for her lungs to fully expand. Medicines won't change this. The best help would come from keeping normal boy weight and doing a lot of walking and other active movement ( swimming, dancing, bike riding) to build stamina. ?

## 2021-10-09 NOTE — Telephone Encounter (Signed)
Called and spoke with patient to let her know of message from Dr. Annamaria Boots in regards to PFT. Patient expressed understanding. Nothing further needed at this time.  ?

## 2021-10-22 DIAGNOSIS — E1169 Type 2 diabetes mellitus with other specified complication: Secondary | ICD-10-CM | POA: Diagnosis not present

## 2021-10-30 ENCOUNTER — Encounter: Payer: Self-pay | Admitting: Internal Medicine

## 2021-10-30 DIAGNOSIS — J9611 Chronic respiratory failure with hypoxia: Secondary | ICD-10-CM | POA: Insufficient documentation

## 2021-10-30 NOTE — Assessment & Plan Note (Signed)
Multifactorial- OHS, OSA, sarcoid, hx PE ?Plan- O2 2L for sleep and exertion ?

## 2021-10-30 NOTE — Assessment & Plan Note (Signed)
Expect this to be burned out by now. ?Plan- ACE level ?

## 2021-10-31 ENCOUNTER — Emergency Department (HOSPITAL_COMMUNITY): Payer: Medicare Other

## 2021-10-31 ENCOUNTER — Emergency Department (HOSPITAL_BASED_OUTPATIENT_CLINIC_OR_DEPARTMENT_OTHER): Payer: Medicare Other

## 2021-10-31 ENCOUNTER — Emergency Department (HOSPITAL_COMMUNITY)
Admission: EM | Admit: 2021-10-31 | Discharge: 2021-10-31 | Disposition: A | Discharge status: Home / Self Care | Payer: Medicare Other | Attending: Emergency Medicine | Admitting: Emergency Medicine

## 2021-10-31 DIAGNOSIS — R52 Pain, unspecified: Secondary | ICD-10-CM | POA: Diagnosis not present

## 2021-10-31 DIAGNOSIS — R5383 Other fatigue: Secondary | ICD-10-CM | POA: Insufficient documentation

## 2021-10-31 DIAGNOSIS — M79604 Pain in right leg: Secondary | ICD-10-CM | POA: Diagnosis not present

## 2021-10-31 DIAGNOSIS — M79662 Pain in left lower leg: Secondary | ICD-10-CM | POA: Diagnosis not present

## 2021-10-31 DIAGNOSIS — Z7982 Long term (current) use of aspirin: Secondary | ICD-10-CM | POA: Diagnosis not present

## 2021-10-31 DIAGNOSIS — Z20822 Contact with and (suspected) exposure to covid-19: Secondary | ICD-10-CM | POA: Diagnosis not present

## 2021-10-31 DIAGNOSIS — R600 Localized edema: Secondary | ICD-10-CM | POA: Insufficient documentation

## 2021-10-31 DIAGNOSIS — R000 Tachycardia, unspecified: Secondary | ICD-10-CM | POA: Diagnosis not present

## 2021-10-31 DIAGNOSIS — M79661 Pain in right lower leg: Secondary | ICD-10-CM | POA: Diagnosis not present

## 2021-10-31 DIAGNOSIS — R0600 Dyspnea, unspecified: Secondary | ICD-10-CM | POA: Diagnosis not present

## 2021-10-31 DIAGNOSIS — R7309 Other abnormal glucose: Secondary | ICD-10-CM | POA: Insufficient documentation

## 2021-10-31 DIAGNOSIS — R0602 Shortness of breath: Secondary | ICD-10-CM | POA: Insufficient documentation

## 2021-10-31 DIAGNOSIS — R079 Chest pain, unspecified: Secondary | ICD-10-CM | POA: Diagnosis not present

## 2021-10-31 DIAGNOSIS — M79605 Pain in left leg: Secondary | ICD-10-CM | POA: Diagnosis not present

## 2021-10-31 DIAGNOSIS — R531 Weakness: Secondary | ICD-10-CM | POA: Insufficient documentation

## 2021-10-31 LAB — CBC
HCT: 41.8 % (ref 36.0–46.0)
Hemoglobin: 13.6 g/dL (ref 12.0–15.0)
MCH: 29.5 pg (ref 26.0–34.0)
MCHC: 32.5 g/dL (ref 30.0–36.0)
MCV: 90.7 fL (ref 80.0–100.0)
Platelets: 243 K/uL (ref 150–400)
RBC: 4.61 MIL/uL (ref 3.87–5.11)
RDW: 12.8 % (ref 11.5–15.5)
WBC: 4.3 K/uL (ref 4.0–10.5)
nRBC: 0 % (ref 0.0–0.2)

## 2021-10-31 LAB — BASIC METABOLIC PANEL WITH GFR
Anion gap: 9 (ref 5–15)
BUN: 5 mg/dL — ABNORMAL LOW (ref 8–23)
CO2: 27 mmol/L (ref 22–32)
Calcium: 9.7 mg/dL (ref 8.9–10.3)
Chloride: 101 mmol/L (ref 98–111)
Creatinine, Ser: 0.71 mg/dL (ref 0.44–1.00)
GFR, Estimated: >60 mL/min
Glucose, Bld: 172 mg/dL — ABNORMAL HIGH (ref 70–99)
Potassium: 3.7 mmol/L (ref 3.5–5.1)
Sodium: 137 mmol/L (ref 135–145)

## 2021-10-31 LAB — URINALYSIS, ROUTINE W REFLEX MICROSCOPIC
Bilirubin Urine: NEGATIVE
Glucose, UA: NEGATIVE mg/dL
Hgb urine dipstick: NEGATIVE
Ketones, ur: NEGATIVE mg/dL
Leukocytes,Ua: NEGATIVE
Nitrite: NEGATIVE
Protein, ur: NEGATIVE mg/dL
Specific Gravity, Urine: 1.005 (ref 1.005–1.030)
pH: 8 (ref 5.0–8.0)

## 2021-10-31 LAB — BRAIN NATRIURETIC PEPTIDE: B Natriuretic Peptide: 12.7 pg/mL (ref 0.0–100.0)

## 2021-10-31 LAB — TROPONIN I (HIGH SENSITIVITY): Troponin I (High Sensitivity): 3 ng/L (ref <18)

## 2021-10-31 LAB — CBG MONITORING, ED: Glucose-Capillary: 155 mg/dL — ABNORMAL HIGH (ref 70–99)

## 2021-10-31 MED ORDER — IOHEXOL 350 MG/ML SOLN
100.0000 mL | Freq: Once | INTRAVENOUS | Status: AC | PRN
  Administered 2021-10-31: 100 mL via INTRAVENOUS

## 2021-10-31 MED ORDER — METHYLPREDNISOLONE SODIUM SUCC 40 MG IJ SOLR: 40.0000 mg | Freq: Once | INTRAMUSCULAR | Status: DC

## 2021-10-31 MED ORDER — DIPHENHYDRAMINE HCL 50 MG/ML IJ SOLN: 50.0000 mg | Freq: Once | INTRAMUSCULAR | Status: DC

## 2021-10-31 MED ORDER — DIPHENHYDRAMINE HCL 25 MG PO CAPS: 50.0000 mg | ORAL_CAPSULE | Freq: Once | ORAL | Status: DC

## 2021-10-31 NOTE — ED Provider Triage Note (Signed)
Emergency Medicine Provider Triage Evaluation Note ? ?Samantha [name] , a 71 y.o. female  was evaluated in triage.  Pt complains of general weakness and left leg pain that began yesterday.  Patient states that yesterday she began to feel tired and had no energy for anything.  She states that it has worsened since yesterday and is much worse today.  Patient also states that she began to have left lateral calf pain that began early yesterday.  It has persisted and is still there today.  Patient is concerned because she has history of a DVT that occurred in 1975 or 1976. ? ?Review of Systems  ?Positive: Leg pain, weakness ?Negative: Chest pain, abdominal pain ? ?Physical Exam  ?BP 132/79 (BP Location: Left Arm)   Pulse (!) 111   Temp 99.1 ?F (37.3 ?C) (Oral)   Resp 16   SpO2 97%  ?Gen:   Awake, no distress   ?Resp:  Normal effort  ?MSK:   Moves extremities without difficulty, pedal edema noted, equal bilaterally ?Other:   ? ?Medical Decision Making  ?Medically screening exam initiated at 11:02 AM.  Appropriate orders placed.  Samantha [name] was informed that the remainder of the evaluation will be completed by another provider, this initial triage assessment does not replace that evaluation, and the importance of remaining in the ED until their evaluation is complete. ? ? ?  ?Dorothyann Peng, PA-C ?10/31/21 1104 ? ?

## 2021-10-31 NOTE — Discharge Instructions (Addendum)
Your work-up today was reassuring.  We did not see any evidence of DVT or pulmonary embolism.  Your labs were also very reassuring. ?Please follow-up with your primary care provider.  If you develop worsening or more concerning symptoms, feel free to return to the emergency department. ?

## 2021-10-31 NOTE — ED Triage Notes (Signed)
Pt. Stated, Im very fatigue, tired. And Im having left leg pain since yesterday and I had a blood clot in 1976 ?

## 2021-10-31 NOTE — ED Provider Notes (Signed)
?[facility] ?Provider Note ? ? ?CSN: [id number] ?Arrival date & time: 10/31/21  1031 ? ?  ? ?History ?PMH: DM, HLD, TIA, Fibromyalgia, Sarcoidosis, HTN, hx of PE and DVT ?Chief Complaint  ?Patient presents with  ? Leg Pain  ? Fatigue  ? ? ?Samantha [name] is a 71 y.o. female. ?Presents the ED with a chief complaint of shortness of breath and bilateral leg pain.  Patient states that she noticed the symptoms yesterday.  She said she first noticed that she was having swelling and pain in her left calf.  She said later on in the day she started noticing similar symptoms on her right calf, but she is unsure because she is chronic osteoarthritis in this leg and often experiences pain here.  She also noticed that she was having increasing shortness of breath with exertion and profound generalized weakness and fatigue.  She denies chest pain, nausea, vomiting, abdominal pain, diarrhea, constipation, visual disturbance, numbness, weakness. ?She states that she had a history of DVT and pulmonary embolism when she was 71 years old.  It was thought to be that this was caused by being on birth control supplements.  She has not had a recurrence of blood clots since then.  She is not on anticoagulation.  She states that her symptoms are very similar to when she had that going on. ?She does state that she had a recent travel to the beach which is a long distance for her about a week and a half ago.  Denies recent Surgery.  Denies hemoptysis.  No history of malignancy. ? ?Also states that she has been under tremendous amount of stress this week.  She states that her mom passed away about 3 years ago and she has been struggling psychologically.  Mother's Day is upcoming, so she also thinks that this could be contributing to her symptoms. ? ? ?Leg Pain ?Associated symptoms: fatigue   ?Associated symptoms: no fever   ? ?  ? ?Home Medications ?Prior to Admission medications   ?Medication Sig Start  Date End Date Taking? Authorizing Provider  ?acetaminophen (TYLENOL) 500 MG tablet Take 1,000 mg by mouth daily as needed for mild pain or headache.    [provider]  ?aspirin 81 MG tablet Take 81 mg by mouth at bedtime.    [provider]  ?Cholecalciferol (VITAMIN D) 50 MCG (2000 UT) tablet Take 2,000 Units by mouth daily with supper.    [provider]  ?CRANBERRY PO Take 1 capsule by mouth daily.    [provider]  ?cyclobenzaprine (FLEXERIL) 10 MG tablet Take 10 mg by mouth at bedtime.    [provider]  ?fluticasone (FLONASE) 50 MCG/ACT nasal spray Place 2 sprays into both nostrils daily as needed for allergies.    [provider]  ?furosemide (LASIX) 40 MG tablet Take 20 mg by mouth 2 (two) times a week. No set days    [provider]  ?lisinopril (ZESTRIL) 2.5 MG tablet Take 2.5 mg by mouth daily.    [provider]  ?loratadine (CLARITIN) 10 MG tablet Take 10 mg by mouth at bedtime.    [provider]  ?Omega-3 Fatty Acids (FISH OIL TRIPLE STRENGTH PO) Take 1,500 mg by mouth daily with supper.    [provider]  ?oxybutynin (DITROPAN) 5 MG tablet Take 5 mg by mouth 3 (three) times daily.    [provider]  ?oxyCODONE-acetaminophen (PERCOCET) 7.5-325 MG tablet Take 1 tablet  by mouth 2 (two) times daily as needed for moderate pain or severe pain. 07/23/19   [provider]  ?Probiotic Product (PROBIOTIC PO) Take 1 capsule by mouth daily with supper.    [provider]  ?Pyridoxine HCl (B-6 PO) Take 1 tablet by mouth daily with supper.    [provider]  ?rosuvastatin (CRESTOR) 20 MG tablet Take 20 mg by mouth at bedtime.    [provider]  ?vitamin B-12 (CYANOCOBALAMIN) 500 MCG tablet Take 500 mcg by mouth daily with supper.    [provider]  ?   ? ?Allergies    ?Other, Amoxicillin-pot clavulanate, Cymbalta [duloxetine hcl], and Metformin hcl   ? ?Review of  Systems   ?Review of Systems  ?Constitutional:  Positive for fatigue. Negative for chills and fever.  ?Respiratory:  Positive for shortness of breath. Negative for cough.   ?Cardiovascular:  Positive for leg swelling. Negative for chest pain.  ?Gastrointestinal:  Negative for abdominal pain, constipation, diarrhea, nausea and vomiting.  ?Neurological:  Positive for weakness.  ?All other systems reviewed and are negative. ? ?Physical Exam ?Updated Vital Signs ?BP 123/74   Pulse (!) 105   Temp 99.2 ?F (37.3 ?C) (Oral)   Resp 16   SpO2 100%  ?Physical Exam ?Vitals and nursing note reviewed.  ?Constitutional:   ?   General: She is not in acute distress. ?   Appearance: Normal appearance. She is not ill-appearing, toxic-appearing or diaphoretic.  ?HENT:  ?   Head: Normocephalic and atraumatic.  ?   Nose: No nasal deformity.  ?   Mouth/Throat:  ?   Lips: Pink. No lesions.  ?   Mouth: No injury, lacerations, oral lesions or angioedema.  ?   Pharynx: Uvula midline. No pharyngeal swelling or uvula swelling.  ?Eyes:  ?   General: Gaze aligned appropriately. No scleral icterus.    ?   Right eye: No discharge.     ?   Left eye: No discharge.  ?   Conjunctiva/sclera: Conjunctivae normal.  ?   Right eye: Right conjunctiva is not injected. No exudate or hemorrhage. ?   Left eye: Left conjunctiva is not injected. No exudate or hemorrhage. ?Cardiovascular:  ?   Rate and Rhythm: Regular rhythm. Tachycardia present.  ?   Pulses: Normal pulses.     ?     Radial pulses are 2+ on the right side and 2+ on the left side.  ?     Dorsalis pedis pulses are 2+ on the right side and 2+ on the left side.  ?   Heart sounds: Normal heart sounds, S1 normal and S2 normal. Heart sounds not distant. No murmur heard. ?  No friction rub. No gallop. No S3 or S4 sounds.  ?Pulmonary:  ?   Effort: Pulmonary effort is normal. No accessory muscle usage or respiratory distress.  ?   Breath sounds: Normal breath sounds. No stridor. No wheezing, rhonchi or  rales.  ?Chest:  ?   Chest wall: No tenderness.  ?Abdominal:  ?   General: Abdomen is flat. There is no distension.  ?   Palpations: Abdomen is soft. There is no mass or pulsatile mass.  ?   Tenderness: There is no abdominal tenderness. There is no right CVA tenderness, left CVA tenderness, guarding or rebound.  ?   Hernia: No hernia is present.  ?Musculoskeletal:  ?   Right lower leg: Edema present.  ?   Left lower leg: Edema present.  ?  Comments: Tenderness behind the left calf to palpation. ?There is also present at the posterior popliteal region of the right knee. ?Both legs are edematous with no pitting edema.  There is no erythema.  They seem to be symmetrical.  ?Skin: ?   General: Skin is warm and dry.  ?   Coloration: Skin is not jaundiced or pale.  ?   Findings: No bruising, erythema, lesion or rash.  ?Neurological:  ?   General: No focal deficit present.  ?   Mental Status: She is alert and oriented to person, place, and time.  ?   GCS: GCS eye subscore is 4. GCS verbal subscore is 5. GCS motor subscore is 6.  ?Psychiatric:     ?   Mood and Affect: Mood normal.     ?   Behavior: Behavior normal. Behavior is cooperative.  ? ? ?ED Results / Procedures / Treatments   ?Labs ?(all labs ordered are listed, but only abnormal results are displayed) ?Labs Reviewed  ?BASIC METABOLIC PANEL - Abnormal; Notable for the following components:  ?    Result Value  ? Glucose, Bld 172 (*)   ? BUN 5 (*)   ? All other components within normal limits  ?URINALYSIS, ROUTINE W REFLEX MICROSCOPIC - Abnormal; Notable for the following components:  ? Color, Urine STRAW (*)   ? All other components within normal limits  ?CBG MONITORING, ED - Abnormal; Notable for the following components:  ? Glucose-Capillary 155 (*)   ? All other components within normal limits  ?RESP PANEL BY RT-PCR (FLU A&B, COVID) [id number]  ?CBC  ?BRAIN NATRIURETIC PEPTIDE  ?TROPONIN I (HIGH SENSITIVITY)  ?TROPONIN I (HIGH SENSITIVITY)   ? ? ?EKG ?None ? ?Radiology ?DG Chest 2 View ? ?Result Date: 10/31/2021 ?CLINICAL DATA:  Dyspnea, bilateral lower extremity pain EXAM: CHEST - 2 VIEW COMPARISON:  04/01/2021 chest radiograph. FINDINGS: Stable cardiomediastinal silhouett

## 2021-10-31 NOTE — ED Notes (Signed)
Pt verbalizes understanding of discharge instructions. Opportunity for questions and answers were provided. Pt discharged from the ED.   ?

## 2021-10-31 NOTE — Progress Notes (Signed)
Lower extremity venous has been completed.  ? ?Preliminary results in CV Proc.  ? ?Samantha [name] Samantha [name] ?10/31/2021 1:42 PM    ?

## 2021-11-05 DIAGNOSIS — R531 Weakness: Secondary | ICD-10-CM | POA: Diagnosis not present

## 2021-11-06 DIAGNOSIS — M79605 Pain in left leg: Secondary | ICD-10-CM | POA: Diagnosis not present

## 2021-11-06 DIAGNOSIS — D869 Sarcoidosis, unspecified: Secondary | ICD-10-CM | POA: Diagnosis not present

## 2021-11-06 DIAGNOSIS — M79604 Pain in right leg: Secondary | ICD-10-CM | POA: Diagnosis not present

## 2021-11-06 DIAGNOSIS — I700 Atherosclerosis of aorta: Secondary | ICD-10-CM | POA: Diagnosis not present

## 2021-11-06 DIAGNOSIS — I2510 Atherosclerotic heart disease of native coronary artery without angina pectoris: Secondary | ICD-10-CM | POA: Diagnosis not present

## 2021-11-22 DIAGNOSIS — E1169 Type 2 diabetes mellitus with other specified complication: Secondary | ICD-10-CM | POA: Diagnosis not present

## 2021-12-06 DIAGNOSIS — R531 Weakness: Secondary | ICD-10-CM | POA: Diagnosis not present

## 2021-12-16 DIAGNOSIS — N390 Urinary tract infection, site not specified: Secondary | ICD-10-CM | POA: Diagnosis not present

## 2021-12-18 DIAGNOSIS — R1030 Lower abdominal pain, unspecified: Secondary | ICD-10-CM | POA: Diagnosis not present

## 2021-12-19 DIAGNOSIS — R1010 Upper abdominal pain, unspecified: Secondary | ICD-10-CM | POA: Diagnosis not present

## 2021-12-22 DIAGNOSIS — E7800 Pure hypercholesterolemia, unspecified: Secondary | ICD-10-CM | POA: Diagnosis not present

## 2021-12-22 DIAGNOSIS — Z87440 Personal history of urinary (tract) infections: Secondary | ICD-10-CM | POA: Diagnosis not present

## 2021-12-22 DIAGNOSIS — E1169 Type 2 diabetes mellitus with other specified complication: Secondary | ICD-10-CM | POA: Diagnosis not present

## 2021-12-22 DIAGNOSIS — I10 Essential (primary) hypertension: Secondary | ICD-10-CM | POA: Diagnosis not present

## 2022-01-02 DIAGNOSIS — G8929 Other chronic pain: Secondary | ICD-10-CM | POA: Diagnosis not present

## 2022-01-02 DIAGNOSIS — E1169 Type 2 diabetes mellitus with other specified complication: Secondary | ICD-10-CM | POA: Diagnosis not present

## 2022-01-02 DIAGNOSIS — E7800 Pure hypercholesterolemia, unspecified: Secondary | ICD-10-CM | POA: Diagnosis not present

## 2022-01-02 DIAGNOSIS — I10 Essential (primary) hypertension: Secondary | ICD-10-CM | POA: Diagnosis not present

## 2022-01-05 DIAGNOSIS — R531 Weakness: Secondary | ICD-10-CM | POA: Diagnosis not present

## 2022-01-16 DIAGNOSIS — N6459 Other signs and symptoms in breast: Secondary | ICD-10-CM | POA: Diagnosis not present

## 2022-01-16 DIAGNOSIS — B9689 Other specified bacterial agents as the cause of diseases classified elsewhere: Secondary | ICD-10-CM | POA: Diagnosis not present

## 2022-01-16 DIAGNOSIS — R300 Dysuria: Secondary | ICD-10-CM | POA: Diagnosis not present

## 2022-01-22 DIAGNOSIS — E1169 Type 2 diabetes mellitus with other specified complication: Secondary | ICD-10-CM | POA: Diagnosis not present

## 2022-01-30 DIAGNOSIS — I10 Essential (primary) hypertension: Secondary | ICD-10-CM | POA: Diagnosis not present

## 2022-01-30 DIAGNOSIS — E1149 Type 2 diabetes mellitus with other diabetic neurological complication: Secondary | ICD-10-CM | POA: Diagnosis not present

## 2022-01-30 DIAGNOSIS — E7800 Pure hypercholesterolemia, unspecified: Secondary | ICD-10-CM | POA: Diagnosis not present

## 2022-01-30 DIAGNOSIS — Z23 Encounter for immunization: Secondary | ICD-10-CM | POA: Diagnosis not present

## 2022-02-05 DIAGNOSIS — R531 Weakness: Secondary | ICD-10-CM | POA: Diagnosis not present

## 2022-03-08 DIAGNOSIS — R531 Weakness: Secondary | ICD-10-CM | POA: Diagnosis not present

## 2022-06-01 IMAGING — CT CT ANGIO CHEST
2 of 9 series · 17 of 36 positions shown · IV contrast (Omnipaque)
Comparison: CTA chest dated January 31, 2019.

CLINICAL DATA: Near-syncope and shortness of breath. Elevated
D-dimer. History of sarcoidosis.

EXAM:
CT ANGIOGRAPHY CHEST WITH CONTRAST
TECHNIQUE: Multidetector CT imaging of the chest was performed using the
standard protocol during bolus administration of intravenous
contrast. Multiplanar CT image reconstructions and MIPs were
obtained to evaluate the vascular anatomy.
CONTRAST:  89mL OMNIPAQUE IOHEXOL 350 MG/ML SOLN

[Series 5: pe thins · axial · 0.81mm/px · z∈[-284,-46]mm · 16 of 362 slices shown]
[im 22/362  lung]
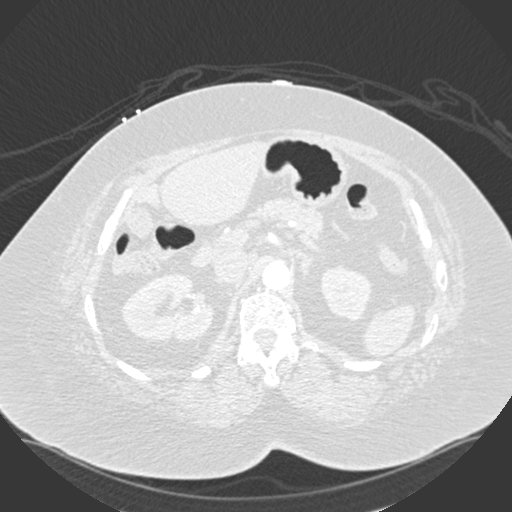
[im 43/362  mediastinal]
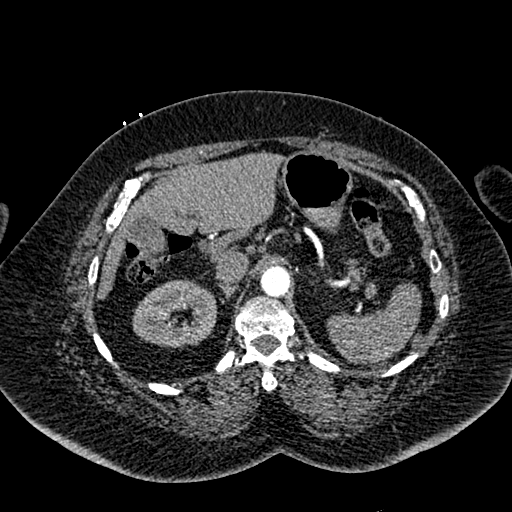
[im 64/362  lung]
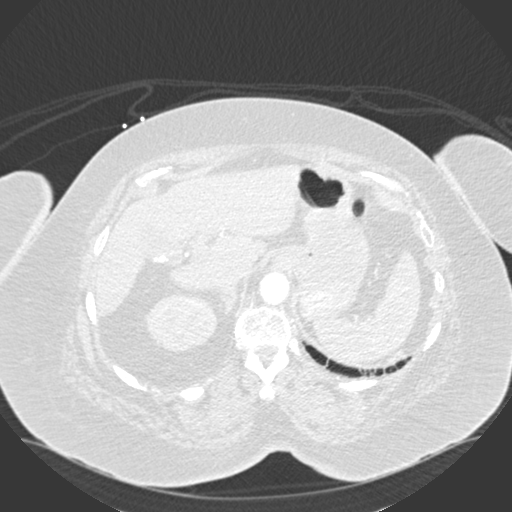
[im 85/362  mediastinal]
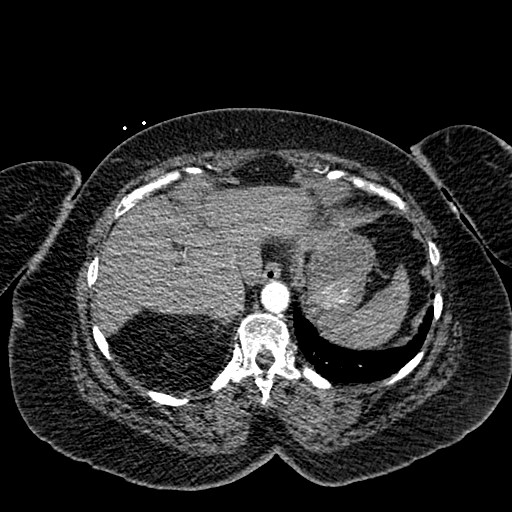
[im 107/362  lung]
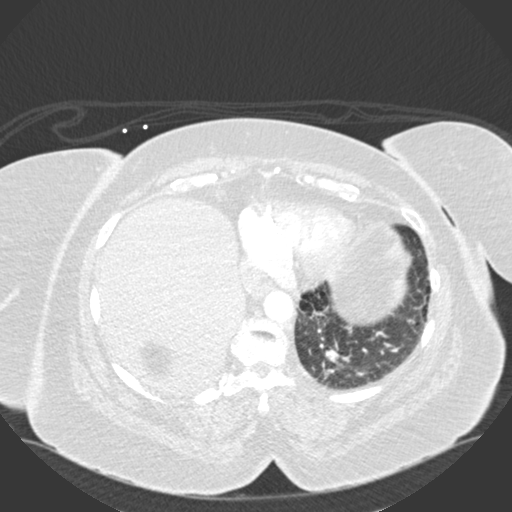
[im 128/362  mediastinal]
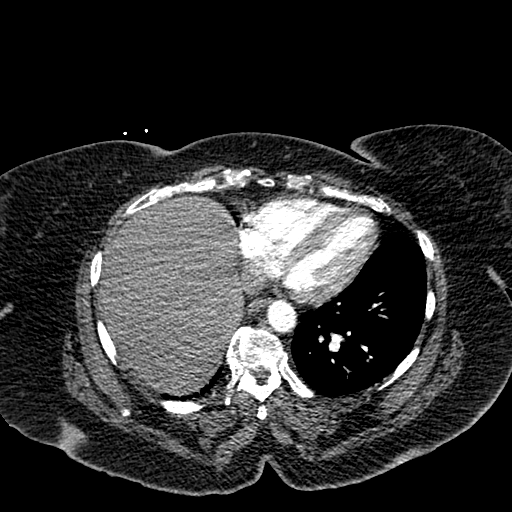
[im 149/362  lung]
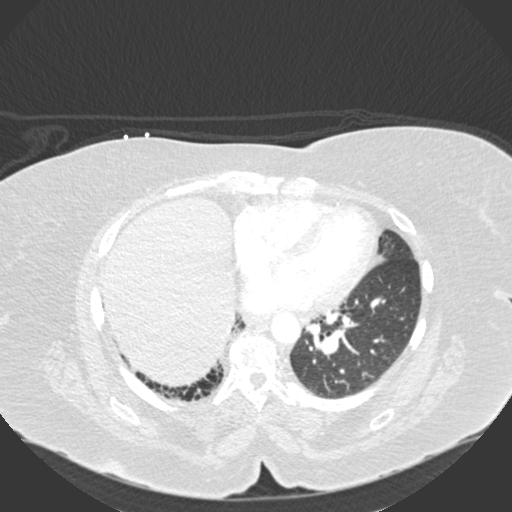
[im 170/362  mediastinal]
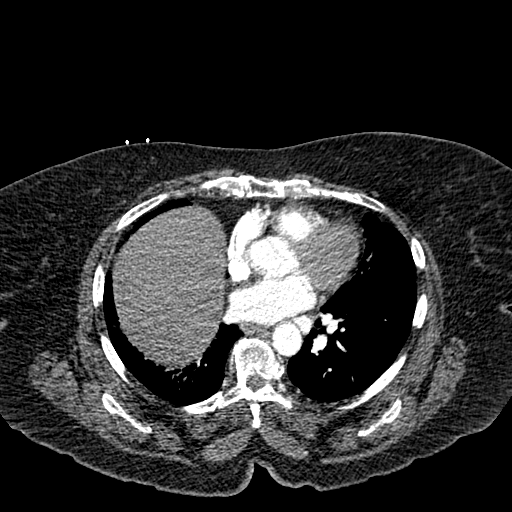
[im 192/362  lung]
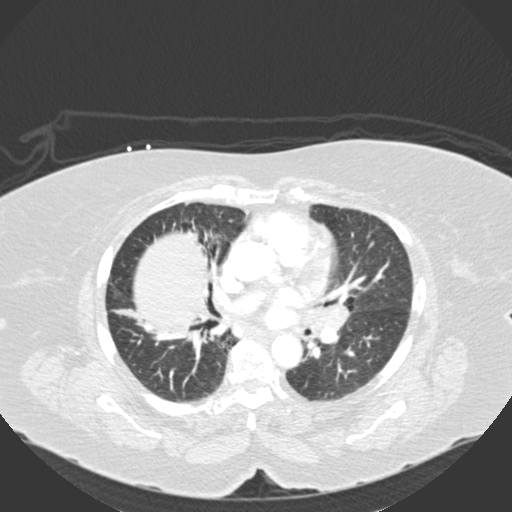
[im 213/362  mediastinal]
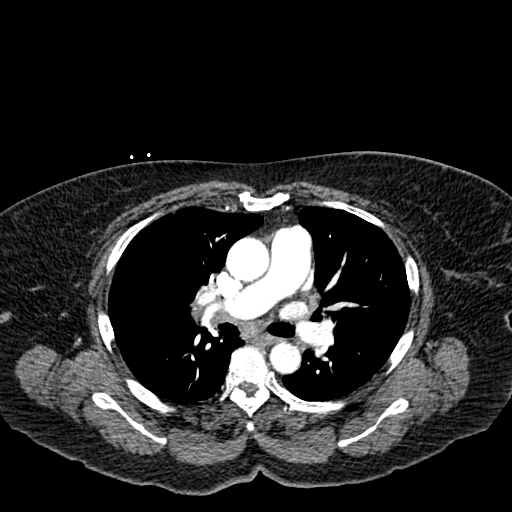
[im 234/362  lung]
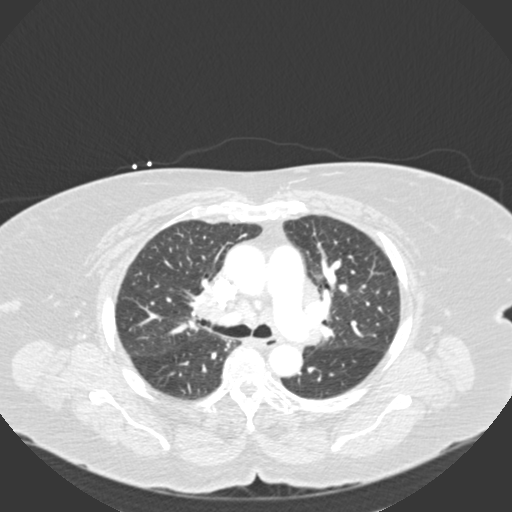
[im 255/362  mediastinal]
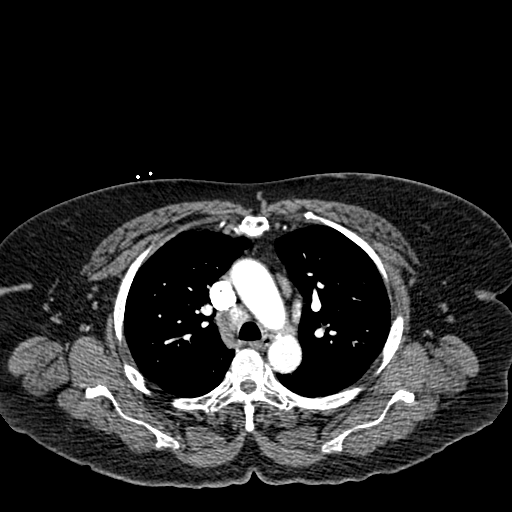
[im 277/362  lung]
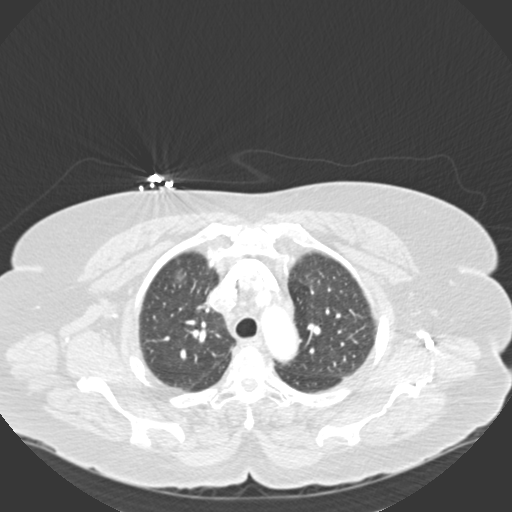
[im 298/362  mediastinal]
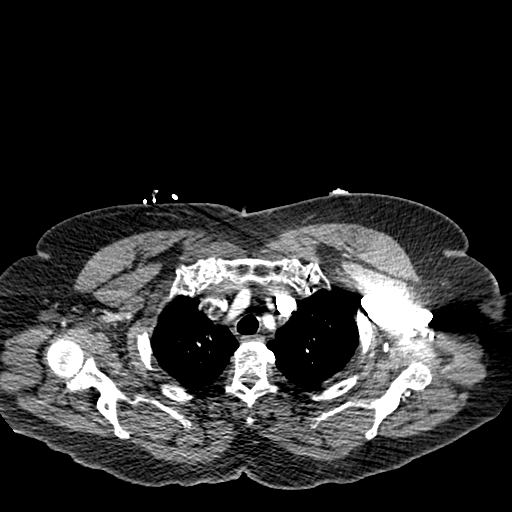
[im 319/362  lung]
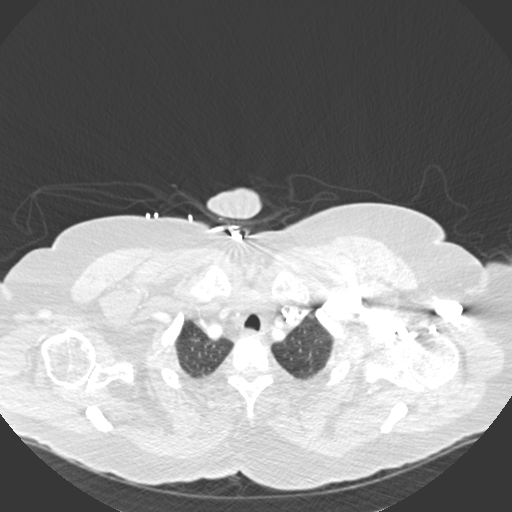
[im 340/362  mediastinal]
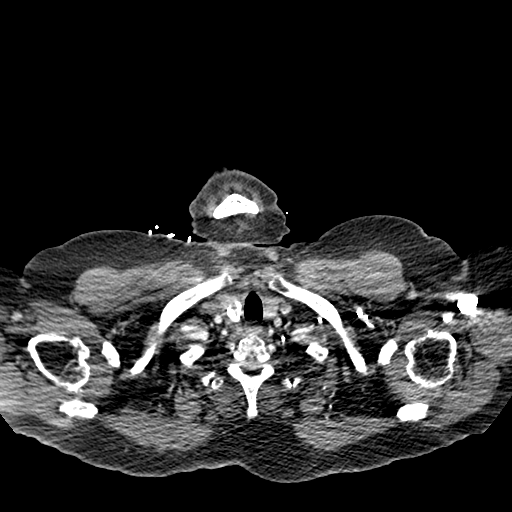

[Series 7: pe coronal mpr · coronal · 0.58mm/px · 1 of 107 slices shown]
[im 54/107  mediastinal]
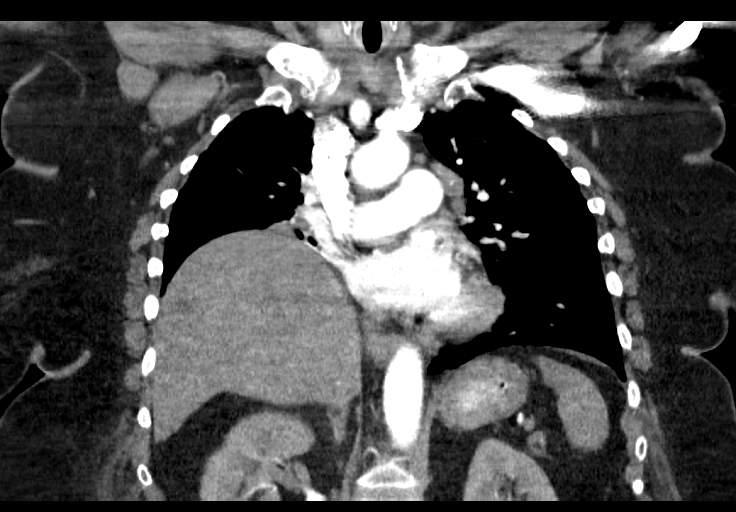

[17 of 36 positions shown; findings below may reference images not displayed]

FINDINGS: Cardiovascular: Satisfactory opacification of the pulmonary arteries
to the segmental level. No evidence of pulmonary embolism. Normal
heart size. No pericardial effusion. No thoracic aortic aneurysm or
dissection. Coronary, aortic arch, and branch vessel atherosclerotic
vascular disease.

Mediastinum/Nodes: Partially calcified mediastinal and bilateral
hilar lymph nodes are unchanged in size since 9191. For example a
right paratracheal lymph node measures 1.5 cm in short axis,
previously 1.5 cm. A right hilar lymph node measures 2.2 cm in short
axis, previously 2.2 cm. Unchanged 1.8 cm partially calcified nodule
in the right thyroid lobe. Stability for greater than 5 years
implies benignity; no biopsy or followup indicated.The trachea and
esophagus demonstrate no significant findings.

Lungs/Pleura: Chronic elevation of the right hemidiaphragm and right
basilar scarring. Unchanged bibasilar subpleural cystic changes. No
focal consolidation, pleural effusion, or pneumothorax.

Upper Abdomen: No acute abnormality. Unchanged multiple small
gallstones.

Musculoskeletal: No chest wall abnormality. No acute or significant
osseous findings.

Review of the MIP images confirms the above findings.
IMPRESSION: 1. No evidence of pulmonary embolism. No acute intrathoracic
process.
2. Unchanged partially calcified mediastinal and bilateral hilar
lymph nodes, consistent with history of sarcoidosis.
3. Unchanged mild lower lobe pulmonary fibrosis.
4. Unchanged cholelithiasis.
5. Aortic Atherosclerosis ([id number]).
